# Patient Record
Sex: Female | Born: 1935 | Race: White | Hispanic: No | Marital: Married | Smoking: Never smoker
Health system: Southern US, Community
[De-identification: ages and names within clinical notes are randomized; demographics above are authoritative.]

## PROBLEM LIST (undated history)

## (undated) DIAGNOSIS — Z8719 Personal history of other diseases of the digestive system: Secondary | ICD-10-CM

## (undated) DIAGNOSIS — Z8489 Family history of other specified conditions: Secondary | ICD-10-CM

## (undated) DIAGNOSIS — J45909 Unspecified asthma, uncomplicated: Secondary | ICD-10-CM

## (undated) DIAGNOSIS — I442 Atrioventricular block, complete: Secondary | ICD-10-CM

## (undated) DIAGNOSIS — E785 Hyperlipidemia, unspecified: Secondary | ICD-10-CM

## (undated) DIAGNOSIS — I10 Essential (primary) hypertension: Secondary | ICD-10-CM

## (undated) DIAGNOSIS — T4145XA Adverse effect of unspecified anesthetic, initial encounter: Secondary | ICD-10-CM

## (undated) DIAGNOSIS — C9200 Acute myeloblastic leukemia, not having achieved remission: Secondary | ICD-10-CM

## (undated) DIAGNOSIS — M316 Other giant cell arteritis: Secondary | ICD-10-CM

## (undated) DIAGNOSIS — M353 Polymyalgia rheumatica: Secondary | ICD-10-CM

## (undated) DIAGNOSIS — I2510 Atherosclerotic heart disease of native coronary artery without angina pectoris: Secondary | ICD-10-CM

## (undated) DIAGNOSIS — M1990 Unspecified osteoarthritis, unspecified site: Secondary | ICD-10-CM

## (undated) DIAGNOSIS — K589 Irritable bowel syndrome without diarrhea: Secondary | ICD-10-CM

## (undated) DIAGNOSIS — Z9889 Other specified postprocedural states: Secondary | ICD-10-CM

## (undated) DIAGNOSIS — Z90710 Acquired absence of both cervix and uterus: Secondary | ICD-10-CM

## (undated) HISTORY — DX: Polymyalgia rheumatica: M35.3

## (undated) HISTORY — PX: CHOLECYSTECTOMY: SHX55

## (undated) HISTORY — PX: EYE SURGERY: SHX253

## (undated) HISTORY — PX: TONSILLECTOMY: SUR1361

## (undated) HISTORY — PX: INSERT / REPLACE / REMOVE PACEMAKER: SUR710

## (undated) HISTORY — PX: ABDOMINAL HYSTERECTOMY: SHX81

## (undated) HISTORY — DX: Acquired absence of both cervix and uterus: Z90.710

## (undated) HISTORY — DX: Essential (primary) hypertension: I10

## (undated) HISTORY — PX: OTHER SURGICAL HISTORY: SHX169

## (undated) HISTORY — PX: APPENDECTOMY: SHX54

## (undated) HISTORY — DX: Other specified postprocedural states: Z98.890

## (undated) HISTORY — DX: Acute myeloblastic leukemia, not having achieved remission: C92.00

## (undated) HISTORY — DX: Atrioventricular block, complete: I44.2

## (undated) HISTORY — PX: TEMPORAL ARTERY BIOPSY / LIGATION: SUR132

## (undated) HISTORY — PX: BREAST BIOPSY: SHX20

## (undated) HISTORY — DX: Hyperlipidemia, unspecified: E78.5

## (undated) HISTORY — DX: Other giant cell arteritis: M31.6

## (undated) HISTORY — DX: Irritable bowel syndrome, unspecified: K58.9

## (undated) HISTORY — PX: KNEE ARTHROSCOPY: SUR90

## (undated) HISTORY — DX: Atherosclerotic heart disease of native coronary artery without angina pectoris: I25.10

---

## 1997-10-15 ENCOUNTER — Other Ambulatory Visit: Admission: RE | Admit: 1997-10-15 | Discharge: 1997-10-15 | Payer: Self-pay | Admitting: *Deleted

## 1998-07-21 ENCOUNTER — Other Ambulatory Visit: Admission: RE | Admit: 1998-07-21 | Discharge: 1998-07-21 | Payer: Self-pay | Admitting: *Deleted

## 1998-07-30 ENCOUNTER — Ambulatory Visit (HOSPITAL_COMMUNITY): Admission: RE | Admit: 1998-07-30 | Discharge: 1998-07-30 | Payer: Self-pay | Admitting: *Deleted

## 1999-04-05 HISTORY — PX: CARDIAC CATHETERIZATION: SHX172

## 1999-06-09 ENCOUNTER — Ambulatory Visit (HOSPITAL_COMMUNITY): Admission: RE | Admit: 1999-06-09 | Discharge: 1999-06-09 | Payer: Self-pay | Admitting: *Deleted

## 1999-07-27 ENCOUNTER — Other Ambulatory Visit: Admission: RE | Admit: 1999-07-27 | Discharge: 1999-07-27 | Payer: Self-pay | Admitting: *Deleted

## 2001-03-19 ENCOUNTER — Encounter: Payer: Self-pay | Admitting: Cardiology

## 2001-03-19 ENCOUNTER — Encounter: Admission: RE | Admit: 2001-03-19 | Discharge: 2001-03-19 | Payer: Self-pay | Admitting: Cardiology

## 2001-03-22 ENCOUNTER — Ambulatory Visit (HOSPITAL_COMMUNITY): Admission: RE | Admit: 2001-03-22 | Discharge: 2001-03-23 | Payer: Self-pay | Admitting: Cardiology

## 2001-05-02 ENCOUNTER — Encounter (HOSPITAL_COMMUNITY): Admission: RE | Admit: 2001-05-02 | Discharge: 2001-07-31 | Payer: Self-pay | Admitting: Cardiology

## 2001-08-16 ENCOUNTER — Encounter: Admission: RE | Admit: 2001-08-16 | Discharge: 2001-08-16 | Payer: Self-pay | Admitting: Internal Medicine

## 2001-08-16 ENCOUNTER — Encounter: Payer: Self-pay | Admitting: Internal Medicine

## 2003-04-25 ENCOUNTER — Encounter: Admission: RE | Admit: 2003-04-25 | Discharge: 2003-04-25 | Payer: Self-pay | Admitting: Internal Medicine

## 2003-06-09 ENCOUNTER — Ambulatory Visit (HOSPITAL_COMMUNITY): Admission: RE | Admit: 2003-06-09 | Discharge: 2003-06-09 | Payer: Self-pay | Admitting: Vascular Surgery

## 2003-06-23 ENCOUNTER — Encounter (INDEPENDENT_AMBULATORY_CARE_PROVIDER_SITE_OTHER): Payer: Self-pay | Admitting: *Deleted

## 2003-06-23 ENCOUNTER — Ambulatory Visit (HOSPITAL_COMMUNITY): Admission: RE | Admit: 2003-06-23 | Discharge: 2003-06-23 | Payer: Self-pay | Admitting: Vascular Surgery

## 2004-06-07 ENCOUNTER — Encounter: Admission: RE | Admit: 2004-06-07 | Discharge: 2004-06-07 | Payer: Self-pay | Admitting: Gynecology

## 2004-07-13 ENCOUNTER — Encounter: Admission: RE | Admit: 2004-07-13 | Discharge: 2004-07-13 | Payer: Self-pay | Admitting: Surgery

## 2004-07-19 ENCOUNTER — Encounter (INDEPENDENT_AMBULATORY_CARE_PROVIDER_SITE_OTHER): Payer: Self-pay | Admitting: *Deleted

## 2004-07-19 ENCOUNTER — Ambulatory Visit (HOSPITAL_BASED_OUTPATIENT_CLINIC_OR_DEPARTMENT_OTHER): Admission: RE | Admit: 2004-07-19 | Discharge: 2004-07-19 | Payer: Self-pay | Admitting: Surgery

## 2004-07-19 ENCOUNTER — Ambulatory Visit (HOSPITAL_COMMUNITY): Admission: RE | Admit: 2004-07-19 | Discharge: 2004-07-19 | Payer: Self-pay | Admitting: Surgery

## 2005-07-15 ENCOUNTER — Encounter: Admission: RE | Admit: 2005-07-15 | Discharge: 2005-07-15 | Payer: Self-pay | Admitting: Gynecology

## 2005-07-22 ENCOUNTER — Encounter: Admission: RE | Admit: 2005-07-22 | Discharge: 2005-07-22 | Payer: Self-pay | Admitting: Gynecology

## 2006-01-12 ENCOUNTER — Encounter: Admission: RE | Admit: 2006-01-12 | Discharge: 2006-01-12 | Payer: Self-pay | Admitting: Gynecology

## 2006-07-17 ENCOUNTER — Encounter: Admission: RE | Admit: 2006-07-17 | Discharge: 2006-07-17 | Payer: Self-pay | Admitting: Gynecology

## 2007-07-18 ENCOUNTER — Encounter: Admission: RE | Admit: 2007-07-18 | Discharge: 2007-07-18 | Payer: Self-pay | Admitting: Internal Medicine

## 2007-07-30 ENCOUNTER — Encounter: Admission: RE | Admit: 2007-07-30 | Discharge: 2007-07-30 | Payer: Self-pay | Admitting: Internal Medicine

## 2007-11-15 HISTORY — PX: PACEMAKER INSERTION: SHX728

## 2007-12-11 ENCOUNTER — Encounter: Admission: RE | Admit: 2007-12-11 | Discharge: 2007-12-11 | Payer: Self-pay | Admitting: Cardiology

## 2008-07-16 ENCOUNTER — Encounter: Admission: RE | Admit: 2008-07-16 | Discharge: 2008-07-16 | Payer: Self-pay | Admitting: Sports Medicine

## 2008-08-04 ENCOUNTER — Encounter: Admission: RE | Admit: 2008-08-04 | Discharge: 2008-08-04 | Payer: Self-pay | Admitting: Internal Medicine

## 2008-10-10 ENCOUNTER — Encounter: Payer: Self-pay | Admitting: Cardiology

## 2008-12-18 ENCOUNTER — Inpatient Hospital Stay (HOSPITAL_COMMUNITY): Admission: EM | Admit: 2008-12-18 | Discharge: 2008-12-21 | Payer: Self-pay | Admitting: Emergency Medicine

## 2008-12-19 ENCOUNTER — Ambulatory Visit: Payer: Self-pay | Admitting: Vascular Surgery

## 2008-12-19 ENCOUNTER — Encounter (INDEPENDENT_AMBULATORY_CARE_PROVIDER_SITE_OTHER): Payer: Self-pay | Admitting: Cardiovascular Disease

## 2008-12-29 DIAGNOSIS — E785 Hyperlipidemia, unspecified: Secondary | ICD-10-CM | POA: Insufficient documentation

## 2008-12-29 DIAGNOSIS — R55 Syncope and collapse: Secondary | ICD-10-CM | POA: Insufficient documentation

## 2008-12-29 DIAGNOSIS — I10 Essential (primary) hypertension: Secondary | ICD-10-CM | POA: Insufficient documentation

## 2008-12-29 DIAGNOSIS — I2510 Atherosclerotic heart disease of native coronary artery without angina pectoris: Secondary | ICD-10-CM

## 2008-12-30 ENCOUNTER — Ambulatory Visit: Payer: Self-pay | Admitting: Cardiology

## 2008-12-30 DIAGNOSIS — Z950 Presence of cardiac pacemaker: Secondary | ICD-10-CM

## 2009-01-29 ENCOUNTER — Ambulatory Visit: Payer: Self-pay | Admitting: Internal Medicine

## 2009-03-02 ENCOUNTER — Ambulatory Visit: Payer: Self-pay | Admitting: Cardiology

## 2009-04-04 DIAGNOSIS — T8859XA Other complications of anesthesia, initial encounter: Secondary | ICD-10-CM

## 2009-04-04 HISTORY — DX: Other complications of anesthesia, initial encounter: T88.59XA

## 2009-05-06 ENCOUNTER — Telehealth (INDEPENDENT_AMBULATORY_CARE_PROVIDER_SITE_OTHER): Payer: Self-pay | Admitting: *Deleted

## 2009-07-06 ENCOUNTER — Ambulatory Visit: Payer: Self-pay

## 2009-07-06 ENCOUNTER — Encounter: Payer: Self-pay | Admitting: Cardiology

## 2009-07-10 ENCOUNTER — Encounter: Admission: RE | Admit: 2009-07-10 | Discharge: 2009-07-10 | Payer: Self-pay | Admitting: Rheumatology

## 2009-08-07 ENCOUNTER — Encounter: Admission: RE | Admit: 2009-08-07 | Discharge: 2009-08-07 | Payer: Self-pay | Admitting: Internal Medicine

## 2009-10-28 ENCOUNTER — Encounter: Admission: RE | Admit: 2009-10-28 | Discharge: 2009-10-28 | Payer: Self-pay | Admitting: Rheumatology

## 2010-01-29 ENCOUNTER — Ambulatory Visit: Payer: Self-pay | Admitting: Cardiology

## 2010-02-03 ENCOUNTER — Encounter: Payer: Self-pay | Admitting: Cardiology

## 2010-02-10 ENCOUNTER — Encounter: Payer: Self-pay | Admitting: Cardiology

## 2010-05-04 NOTE — Cardiovascular Report (Signed)
Summary: Office Visit   Office Visit   Imported By: Roderic Ovens 08/24/2009 16:32:54  _____________________________________________________________________  External Attachment:    Type:   Image     Comment:   External Document

## 2010-05-04 NOTE — Progress Notes (Signed)
  Faxed Cardiac records over to [facility] to fax [phone] [facility]  May 06, 2009 11:15 AM

## 2010-05-04 NOTE — Assessment & Plan Note (Signed)
Summary: 414.01 780.2  DUE 10/11  PFH,RN   Visit Type:  Follow-up Primary Provider:  Dr. Felipa Eth  CC:  Syncope and CAD.  History of Present Illness: The patient returns for yearly followup. Since I last saw her she has had no new cardiovascular complaints. Unfortunately she is limited by multiple joint pains. She does some activities such as vacuuming. With this she does not get chest pressure, neck or arm discomfort. She does not have new shortness of breath, PND or orthopnea. She does not have any palpitations and has had no further syncope. Her pacemaker was interrogated today and she is pacing about 72% of the time with normal function.  Current Medications (verified): 1)  Lipitor 20 Mg Tabs (Atorvastatin Calcium) .... Daily 2)  Aspirin 81 Mg  Tabs (Aspirin) .... Daily 3)  I Caps .... Daily 4)  Tramadol Hcl 50 Mg Tabs (Tramadol Hcl) .... As Needed 5)  Prednisone 5 Mg Tabs (Prednisone) .... Uad  Allergies (verified): 1)  ! * Tramadol 2)  ! Codeine  Past History:  Past Medical History:  Syncope,  Coronary artery disease with non DES stent of her RCA in  2002.    Hypertension.   Hyperlipidemia.   History of St. Jude PM placed for complete heart block.   Irritable bowel syndrome  Polymyalgia rheumatica  Giant cell arteritis  Past Surgical History: Reviewed history from 12/30/2008 and no changes required. Recent arthroscopy of  Appendectomy Tonsillectomy Cholecystectomy Hysterectomy Breast biopsy secondary to nipple discharge Pilonidal cyst resection C-Section Temporal artery biopsy  Review of Systems       As stated in the HPI and negative for all other systems.   Vital Signs:  Patient profile:   76 year old female Height:      61 inches Weight:      149 pounds BMI:     28.26 Pulse rate:   69 / minute Resp:     16 per minute BP sitting:   136 / 64  (right arm)  Vitals Entered By: Marrion Coy, CNA (January 29, 2010 12:10 PM)  Physical Exam  General:  Well  developed, well nourished, in no acute distress. Head:  normocephalic and atraumatic Eyes:  PERRLA/EOM intact; conjunctiva and lids normal. Neck:  Neck supple, no JVD. No masses, thyromegaly or abnormal cervical nodes. Chest Wall:  no deformities, well healed pacer pocket Lungs:  Clear bilaterally to auscultation and percussion. Abdomen:  Bowel sounds positive; abdomen soft and non-tender without masses, organomegaly, or hernias noted. No hepatosplenomegaly. Msk:  Back normal, normal gait. Muscle strength and tone normal. Extremities:  No clubbing or cyanosis. Neurologic:  Alert and oriented x 3. Skin:  Intact without lesions or rashes. Cervical Nodes:  no significant adenopathy Axillary Nodes:  no significant adenopathy Inguinal Nodes:  no significant adenopathy Psych:  Normal affect.   Detailed Cardiovascular Exam  Neck    Carotids: Carotids full and equal bilaterally without bruits.      Neck Veins: Normal, no JVD.    Heart    Inspection: no deformities or lifts noted.      Palpation: normal PMI with no thrills palpable.      Auscultation: regular rate and rhythm, S1, S2 without murmurs, rubs, gallops, or clicks.    Vascular    Abdominal Aorta: no palpable masses, pulsations, or audible bruits.      Femoral Pulses: normal femoral pulses bilaterally.      Pedal Pulses: normal pedal pulses bilaterally.  Radial Pulses: normal radial pulses bilaterally.      Peripheral Circulation: no clubbing, cyanosis, or edema noted with normal capillary refill.     EKG  Procedure date:  01/29/2010  Findings:      AV pacing 100% capture  PPM Specifications Following MD:  Rollene Rotunda, MD     PPM Vendor:  St Jude     PPM Model Number:  [id number]     PPM Serial Number:  [id number] DOI:  11/15/2007     PPM Implanting MD:  NOT IMPLANTED HERE  Lead 1    Location: RA     DOI: 11/15/2007     Model #: 1642T     Serial #: [id number]     Status: active Lead 2    Location: RV     DOI:  11/15/2007     Model #: 1888TC     Serial #: [id number]     Status: active  Magnet Response Rate:  BOL 98.6 ERI 86.3  Indications:  CHB Pacer dependent   PPM Follow Up Battery Voltage:  2.78 V     Battery Est. Longevity:  6.75-9.25 yrs     Pacer Dependent:  Yes       PPM Device Measurements Atrium  Amplitude: 2.9 mV, Impedance: 467 ohms, Threshold: 0.50 V at 0.5 msec Right Ventricle  Amplitude: PACED mV, Impedance: 693 ohms, Threshold: 0.750 V at 0.5 msec  Episodes MS Episodes:  1     Percent Mode Switch:  <1%     Coumadin:  No Ventricular High Rate:  0     Atrial Pacing:  69%     Ventricular Pacing:  99%  Parameters Mode:  DDDR     Lower Rate Limit:  70     Upper Rate Limit:  120 Paced AV Delay:  200     Sensed AV Delay:  180 Next Cardiology Appt Due:  08/03/2010 Tech Comments:  1 AMS EPISODES--LONGEST WAS 6 SECONDS.  NORMAL DEVICE FUNCTION.  PACER DEPENDENT.  CHANGED RV OUTPUT FROM 1.125 TO 1.00 V DUE TO AUTOCAPTURE BEING ON.  ROV IN 6 MTHS W/DEVICE CLINIC. Vella Kohler  January 29, 2010 12:38 PM  Impression & Recommendations:  Problem # 1:  CAD (ICD-414.00) She has had no new chest discomfort reminiscent of previous problems. She will continue with risk reduction. Orders: EKG w/ Interpretation (93000)  Problem # 2:  PACEMAKER, PERMANENT (ICD-V45.01) Her pacemaker is functioning normally and she will be seen in pacer clinic in 6 months. Orders: EKG w/ Interpretation (93000)  Problem # 3:  HYPERTENSION (ICD-401.9)  Her blood pressure is controlled. She will continue the meds as listed.  Problem # 4:  HYPERLIPIDEMIA (ICD-272.4) Per her primary M.D. She is due to have this drawn soon. The goal should be an LDL less than 100 and HDL greater than 50.  Patient Instructions: 1)  Your physician recommends that you schedule a follow-up appointment in: 12 months 2)  Your physician recommends that you continue on your current medications as directed. Please refer to the  Current Medication list given to you today.

## 2010-05-04 NOTE — Letter (Signed)
Summary: [facility] Annual Physical Exam  [facility] Annual Physical Exam   Imported By: Roderic Ovens 03/11/2010 10:18:25  _____________________________________________________________________  External Attachment:    Type:   Image     Comment:   External Document

## 2010-05-04 NOTE — Procedures (Signed)
Summary: device check st jude   Current Medications (verified): 1)  Lipitor 20 Mg Tabs (Atorvastatin Calcium) .... Daily 2)  Toprol Xl 25 Mg Xr24h-Tab (Metoprolol Succinate) .Marland Kitchen.. 1 By Mouth Daily 3)  Aspirin 81 Mg  Tabs (Aspirin) .... Daily 4)  I Caps .... Daily  Allergies (verified): 1)  ! * Tramadol 2)  ! Codeine   PPM Specifications Following MD:  Rollene Rotunda, MD     PPM Vendor:  St Jude     PPM Model Number:  [id number]     PPM Serial Number:  [id number] PPM DOI:  11/15/2007     PPM Implanting MD:  NOT IMPLANTED HERE  Lead 1    Location: RA     DOI: 11/15/2007     Model #: 1642T     Serial #: [id number]     Status: active Lead 2    Location: RV     DOI: 11/15/2007     Model #: 1888TC     Serial #: [id number]     Status: active  Magnet Response Rate:  BOL 98.6 ERI 86.3  Indications:  CHB Pacer dependent   PPM Follow Up Remote Check?  No Battery Voltage:  2.80 V     Battery Est. Longevity:  10 YEARS     Pacer Dependent:  Yes       PPM Device Measurements Atrium  Amplitude: 2.1 mV, Impedance: 475 ohms, Threshold: 0.5 V at 0.5 msec Right Ventricle  Amplitude: PACED AT 30 mV, Impedance: 687 ohms, Threshold: 0.75 V at 0.5 msec  Episodes MS Episodes:  12     Percent Mode Switch:  <1%     Coumadin:  No Ventricular High Rate:  NOT AVAILABLE     Atrial Pacing:  59%     Ventricular Pacing:  >99%  Parameters Mode:  DDDR     Lower Rate Limit:  70     Upper Rate Limit:  120 Paced AV Delay:  200     Sensed AV Delay:  180 Next Cardiology Appt Due:  01/02/2010 Tech Comments:  Normal device function.  EGM storage turned off today to promote battery longevity.  No other changes made.  All mode switch episodes less than 1 minute.  ROV 6 months JH. Gypsy Balsam RN BSN  July 06, 2009 3:40 PM

## 2010-05-04 NOTE — Cardiovascular Report (Signed)
Summary: Office Visit   Office Visit   Imported By: Roderic Ovens 02/03/2010 16:18:16  _____________________________________________________________________  External Attachment:    Type:   Image     Comment:   External Document

## 2010-06-15 ENCOUNTER — Other Ambulatory Visit: Payer: Self-pay | Admitting: Rheumatology

## 2010-06-15 ENCOUNTER — Ambulatory Visit
Admission: RE | Admit: 2010-06-15 | Discharge: 2010-06-15 | Disposition: A | Discharge status: Home / Self Care | Payer: Medicare Other | Source: Ambulatory Visit | Attending: Rheumatology | Admitting: Rheumatology

## 2010-06-15 DIAGNOSIS — M549 Dorsalgia, unspecified: Secondary | ICD-10-CM

## 2010-06-15 DIAGNOSIS — R079 Chest pain, unspecified: Secondary | ICD-10-CM

## 2010-06-15 MED ORDER — IOHEXOL 300 MG/ML  SOLN
75.0000 mL | Freq: Once | INTRAMUSCULAR | Status: AC | PRN
  Administered 2010-06-15: 75 mL via INTRAVENOUS

## 2010-07-09 LAB — BASIC METABOLIC PANEL
BUN: 16 mg/dL (ref 6–23)
BUN: 18 mg/dL (ref 6–23)
CO2: 27 mEq/L (ref 19–32)
CO2: 28 mEq/L (ref 19–32)
CO2: 29 mEq/L (ref 19–32)
Calcium: 8.7 mg/dL (ref 8.4–10.5)
Calcium: 8.9 mg/dL (ref 8.4–10.5)
Chloride: 102 mEq/L (ref 96–112)
Chloride: 104 mEq/L (ref 96–112)
Chloride: 105 mEq/L (ref 96–112)
Chloride: 106 mEq/L (ref 96–112)
Creatinine, Ser: 0.87 mg/dL (ref 0.4–1.2)
Creatinine, Ser: 0.91 mg/dL (ref 0.4–1.2)
Creatinine, Ser: 0.93 mg/dL (ref 0.4–1.2)
GFR calc Af Amer: >60 mL/min (ref >60)
GFR calc Af Amer: >60 mL/min (ref >60)
GFR calc Af Amer: >60 mL/min (ref >60)
GFR calc non Af Amer: 59 mL/min — ABNORMAL LOW (ref >60)
GFR calc non Af Amer: >60 mL/min (ref >60)
GFR calc non Af Amer: >60 mL/min (ref >60)
GFR calc non Af Amer: >60 mL/min (ref >60)
Glucose, Bld: 105 mg/dL — ABNORMAL HIGH (ref 70–99)
Potassium: 3.7 mEq/L (ref 3.5–5.1)
Potassium: 3.9 mEq/L (ref 3.5–5.1)
Potassium: 4 mEq/L (ref 3.5–5.1)
Potassium: 4.2 mEq/L (ref 3.5–5.1)
Sodium: 140 mEq/L (ref 135–145)
Sodium: 140 mEq/L (ref 135–145)

## 2010-07-09 LAB — CBC
HCT: 33.7 % — ABNORMAL LOW (ref 36.0–46.0)
HCT: 35.9 % — ABNORMAL LOW (ref 36.0–46.0)
HCT: 36.1 % (ref 36.0–46.0)
HCT: 37.6 % (ref 36.0–46.0)
HCT: 39.1 % (ref 36.0–46.0)
Hemoglobin: 11.3 g/dL — ABNORMAL LOW (ref 12.0–15.0)
Hemoglobin: 12.1 g/dL (ref 12.0–15.0)
Hemoglobin: 13.1 g/dL (ref 12.0–15.0)
MCHC: 33.6 g/dL (ref 30.0–36.0)
MCHC: 33.7 g/dL (ref 30.0–36.0)
MCV: 88.8 fL (ref 78.0–100.0)
MCV: 89.4 fL (ref 78.0–100.0)
MCV: 89.6 fL (ref 78.0–100.0)
Platelets: 195 10*3/uL (ref 150–400)
Platelets: 219 10*3/uL (ref 150–400)
RBC: 4.02 MIL/uL (ref 3.87–5.11)
RBC: 4.04 MIL/uL (ref 3.87–5.11)
RDW: 13 % (ref 11.5–15.5)
RDW: 13.5 % (ref 11.5–15.5)
WBC: 7 10*3/uL (ref 4.0–10.5)
WBC: 7.1 10*3/uL (ref 4.0–10.5)
WBC: 8.8 10*3/uL (ref 4.0–10.5)
WBC: 8.8 10*3/uL (ref 4.0–10.5)

## 2010-07-09 LAB — TROPONIN I: Troponin I: 0.13 ng/mL — ABNORMAL HIGH (ref 0.00–0.06)

## 2010-07-09 LAB — CK TOTAL AND CKMB (NOT AT ARMC)
CK, MB: 1.3 ng/mL (ref 0.3–4.0)
Relative Index: INVALID (ref 0.0–2.5)
Total CK: 39 U/L (ref 7–177)

## 2010-07-09 LAB — MAGNESIUM
Magnesium: 2 mg/dL (ref 1.5–2.5)
Magnesium: 2.1 mg/dL (ref 1.5–2.5)

## 2010-07-09 LAB — DIFFERENTIAL
Basophils Relative: 0 % (ref 0–1)
Eosinophils Absolute: 0.1 10*3/uL (ref 0.0–0.7)
Eosinophils Relative: 1 % (ref 0–5)
Monocytes Absolute: 0.4 10*3/uL (ref 0.1–1.0)
Monocytes Relative: 5 % (ref 3–12)

## 2010-07-09 LAB — CARDIAC PANEL(CRET KIN+CKTOT+MB+TROPI)
Relative Index: INVALID (ref 0.0–2.5)
Total CK: 35 U/L (ref 7–177)
Troponin I: 0.1 ng/mL — ABNORMAL HIGH (ref 0.00–0.06)

## 2010-07-09 LAB — HEPARIN LEVEL (UNFRACTIONATED)
Heparin Unfractionated: 0.11 IU/mL — ABNORMAL LOW (ref 0.30–0.70)
Heparin Unfractionated: 0.29 IU/mL — ABNORMAL LOW (ref 0.30–0.70)

## 2010-07-09 LAB — POCT CARDIAC MARKERS: Myoglobin, poc: 89.8 ng/mL (ref 12–200)

## 2010-07-14 ENCOUNTER — Other Ambulatory Visit: Payer: Self-pay | Admitting: Internal Medicine

## 2010-07-14 DIAGNOSIS — Z1231 Encounter for screening mammogram for malignant neoplasm of breast: Secondary | ICD-10-CM

## 2010-07-15 ENCOUNTER — Encounter: Payer: Self-pay | Admitting: *Deleted

## 2010-07-28 ENCOUNTER — Ambulatory Visit (INDEPENDENT_AMBULATORY_CARE_PROVIDER_SITE_OTHER): Payer: Medicare Other | Admitting: *Deleted

## 2010-07-28 DIAGNOSIS — I442 Atrioventricular block, complete: Secondary | ICD-10-CM

## 2010-08-20 NOTE — Cardiovascular Report (Signed)
[place]  Patient:    Jamie Reese, Jamie Reese Visit Number: [id number] MRN: [id number]          Service Type: Attending:  Julieanne Manson, M.D. Dictated by:   Julieanne Manson, M.D. Proc. Date: 03/22/01   CC:         Petra Kuba, M.D.  Cardiac Catheterization Laboratory   Cardiac Catheterization  INDICATIONS FOR PROCEDURE: The patient is a 77 year old female, who has a chronic left bundle-branch block. She has developed chest pain over the last several months that has progressed to point it wakes her up at sleeps, radiates to her upper back relieved with nitroglycerin. Because of her left bundle-branch block a Persantine Cardiolite was done that showed significant inferolateral ischemia. She is brought in for cardiac catheterization.  DESCRIPTION OF PROCEDURE: The patient was prepped and draped in the usual sterile fashion exposing the right groin.  Following local anesthetic with 1% Xylocaine, the Seldinger technique was employed and a 5 [place] introducer sheath was placed in the right femoral artery.  Selective right and left coronary arteriography and ventriculography in the RAO projection was performed.  EQUIPMENT: A 5 French Judkins configuration catheters.  COMPLICATIONS: None.  RESULTS: 1. Hemodynamic monitoring: Central aortic pressure 130/61, left ventricular    pressure 130/15 with no aortic valve gradient noted at the time of    pullback. 2. Ventriculography: Ventriculography in the RAO projection revealed normal    left ventricular systolic function with ejection fraction of over 55%. The    end-diastolic pressure was 17. The posterobasilar segment appeared to be    akinetic. There was mitral valve prolapse without mitral regurgitation.  CORONARY ARTERIOGRAPHY: No calcification was appreciated on fluoroscopy in the distribution of the coronary arteries. 1. Left main: Normal. 2. LAD: The LAD crossed the apex of the heart and gave  rise to one large    first diagonal branch. This system was free of disease. 3. Circumflex: Minor irregularities otherwise normal. 4. Optimal diagonal: Large vessel free of disease. 5. Right coronary artery: The right coronary artery had a focal area of    95% obstruction in its midportion. There were irregularities in the    proximal and distal portion of the right coronary artery, the posterior    descending artery and posterolateral vessel were free of disease.  Because of the high-grade stenosis found in the right coronary artery arrangements were made for intervention.  A 7 French sheath was exchanged with a previously placed 5 [place] sheath. The JR4 7 [place] guide catheter with side holes was used. A short luge wire was placed down the right coronary artery of the distal portion of the posterolateral vessel. A 2.5 x 15 CrossSail was placed in the area of the the obstruction in two inflations, one 8 for 58 seconds and 8 for 45 seconds was performed. With this, there was less than 40% narrowing following the angioplasty.  A stent was made ready, a 2.75 x 12 Express stent. It was placed in such a manner and both the proximal and distal portions were well covered and it was initially deployed at 12 x 50 seconds, and the final inflation was 11 x 58 seconds.  Following inflation of the stent, the vessel appeared to be hyperexpanded at the area of the stent.  There was no evidence of any dissection or thrombus.  The previously 95% area now appeared to be normal to slightly hyperexpanded.  The patient was given 5700 units of intravenous heparin. He  was given double bolus Integrilin and on an Integrilin drip and at the end of the procedure received 150 mg of oral Plavix. Total contrast 150 cc.  Integrilin will be continued for 12 hours.  The patient should be ready for discharge in the morning. Dictated by:   Julieanne Manson, M.D. Attending:  Julieanne Manson, M.D. DD:  03/22/01 TD:   03/22/01 Job: [id number]

## 2010-08-20 NOTE — Op Note (Signed)
NAMEJERRILYNN, Jamie Reese NO.:  [id number]   MEDICAL RECORD NO.:  [id number]          PATIENT TYPE:  INP   LOCATION:                               FACILITY:  [facility]   PHYSICIAN:  Currie Paris, M.D.DATE OF BIRTH:  March 01, 1934   DATE OF PROCEDURE:  07/19/2004  DATE OF DISCHARGE:                                 OPERATIVE REPORT   Clincal history: The patient has a spontaneous and copious nipple discharge  on the left.Ductogram had shown what appeared to be some abnormalities of  that particular duct system and excision was recommended.   DESCRIPTION OF PROCEDURE:  Patient was seen in the holding area and she had  no further questions.  We clarified that the small incidentally noted cystic  area in the upper breast was not to be biopsied, simply the area of the  discharging duct.  The left breast was marked both by the patient and myself  as the operative site.   The patient was taken to the operating room and after satisfactory general  anesthesia had been obtained, the left breast was prepped and draped.  The  timeout occurred.   I first injected a little bit of methylene blue into the discharging duct.  I then made a curvilinear incision at the areolar margin and elevated the  skin off of the areola over to the duct where I disconnected the duct from  the remaining tissue and took a little bit of skin button so that we had the  entire origin of the duct.  I then traced the duct deep into the breast and  took a wide excision of breast tissue around it.  The duct actually seemed  to track a little bit superiorly as it got deeper into the breast.  As I  came across a little blue dye and a duct, I went ahead and excised more to  make sure we were completely around this at the margins.  Eventually I had  no blue dye left, so I thought I had completely excised this ductal system.   Bleeders were controlled with cautery.  I injected 0.25% plain Marcaine to  help with  postoperative analgesia.  I closed the breast tissue with some 3-0  Vicryl and the skin with a 4-0 Monocryl subcuticular.  Dermabond was also  used.   The patient tolerated the procedure well.  There were no operative  complications.  All counts were correct.      CJS/MEDQ  D:  07/19/2004  T:  07/19/2004  Job:  [id number]

## 2010-08-20 NOTE — Op Note (Signed)
NAME:  VENEDA, KIRKSEY                         ACCOUNT NO.:  [id number]   MEDICAL RECORD NO.:  [id number]                   PATIENT TYPE:  OIB   LOCATION:  2899                                 FACILITY:  [facility]   PHYSICIAN:  Quita Skye. Hart Rochester, M.D.               DATE OF BIRTH:  06-10-1933   DATE OF PROCEDURE:  06/23/2003  DATE OF DISCHARGE:  06/23/2003                                 OPERATIVE REPORT   PREOPERATIVE DIAGNOSIS:  Rule out temporal arteritis.   POSTOPERATIVE DIAGNOSIS:  Rule out temporal arteritis.   OPERATION PERFORMED:  Bilateral temporal artery biopsy.   SURGEON:  Quita Skye. Hart Rochester, M.D.   ASSISTANT:  Nurse.   ANESTHESIA:  Local.   DESCRIPTION OF PROCEDURE:  The patient was taken to the operating room and  placed in the supine position at which time the left superficial temporal  area was identified and shaved, prepped with Betadine solution and draped in  routine sterile manner.  After infiltration with 1% Xylocaine, a short  transverse incision was made in the superficial temporal area after  identifying the artery with the Doppler.  Dissection was carried down  through subcutaneous tissue and the artery was identified.  It was quite  small but pulsatile.  It was ligated proximally and distally with 3-0 silk  tie and about a 1 to 1.5 cm segment removed.  Adequate hemostasis achieved.  The wound closed with Vicryl in subcuticular fashion and Steri-Strip.  Following this, the right side was prepped in similar fashion and draped in  routine sterile manner and the superficial temporal artery was also  identified on the right with the Doppler.  Short transverse incision was  made and the artery dissected free proximally and distally.  Also ligated  proximally and distally with 3-0 silk tie and divided and a 1 to 2 cm  segment removed.  Both pieces were sent to the lab for evaluation and the  wound was also closed with Vicryl in a subcuticular fashion.  Steri-Strips  and  sterile dressing applied.  The patient was taken to the recovery room in  satisfactory condition.                                               Quita Skye Hart Rochester, M.D.    JDL/MEDQ  D:  06/23/2003  T:  06/24/2003  Job:  [id number]

## 2010-08-24 ENCOUNTER — Ambulatory Visit
Admission: RE | Admit: 2010-08-24 | Discharge: 2010-08-24 | Disposition: A | Discharge status: Home / Self Care | Payer: Medicare Other | Source: Ambulatory Visit | Attending: Internal Medicine | Admitting: Internal Medicine

## 2010-08-24 DIAGNOSIS — Z1231 Encounter for screening mammogram for malignant neoplasm of breast: Secondary | ICD-10-CM

## 2010-12-31 ENCOUNTER — Encounter: Payer: Self-pay | Admitting: Cardiology

## 2011-01-04 ENCOUNTER — Encounter: Payer: Self-pay | Admitting: Cardiology

## 2011-01-04 ENCOUNTER — Ambulatory Visit (INDEPENDENT_AMBULATORY_CARE_PROVIDER_SITE_OTHER): Payer: Medicare Other | Admitting: Cardiology

## 2011-01-04 DIAGNOSIS — E785 Hyperlipidemia, unspecified: Secondary | ICD-10-CM

## 2011-01-04 DIAGNOSIS — Z950 Presence of cardiac pacemaker: Secondary | ICD-10-CM

## 2011-01-04 DIAGNOSIS — I442 Atrioventricular block, complete: Secondary | ICD-10-CM

## 2011-01-04 DIAGNOSIS — I10 Essential (primary) hypertension: Secondary | ICD-10-CM

## 2011-01-04 DIAGNOSIS — R55 Syncope and collapse: Secondary | ICD-10-CM

## 2011-01-04 DIAGNOSIS — I2510 Atherosclerotic heart disease of native coronary artery without angina pectoris: Secondary | ICD-10-CM

## 2011-01-04 LAB — PACEMAKER DEVICE OBSERVATION
AL AMPLITUDE: 2.4 mv
BATTERY VOLTAGE: 2.79 V
RV LEAD AMPLITUDE: 4 mv
RV LEAD IMPEDENCE PM: 645 Ohm
VENTRICULAR PACING PM: 100

## 2011-01-04 NOTE — Assessment & Plan Note (Signed)
The blood pressure is at target. No change in medications is indicated. We will continue with therapeutic lifestyle changes (TLC).  

## 2011-01-04 NOTE — Patient Instructions (Signed)
The current medical regimen is effective;  continue present plan and medications.  Follow up in 1 year with Dr Hochrein.  You will receive a letter in the mail 2 months before you are due.  Please call us when you receive this letter to schedule your follow up appointment.  

## 2011-01-04 NOTE — Progress Notes (Signed)
HPI The patient presents for followup of her known coronary disease and pacemaker. Since I last saw her she has mostly been inhibited in her activities by neck pain and a pleuritic-type back pain that is worse with breathing or motion. She has had a workup of this with an unclear etiology. I did note a CT done earlier this year. She does get some relief with steroids. She doesn't tolerate very many pain medications. This has somewhat limited her activities. She has not been having any chest discomfort similar to previous cardiovascular complaints. She hasn't had any palpitations, presyncope or syncope. She has no new shortness of breath, PND or orthopnea. She has had no weight gain or edema.    Allergies  Allergen Reactions  . Codeine   . Cymbalta (Duloxetine Hcl)   . Tramadol     Current Outpatient Prescriptions  Medication Sig Dispense Refill  . aspirin 81 MG tablet Take 81 mg by mouth daily.        Marland Kitchen atorvastatin (LIPITOR) 20 MG tablet Take 20 mg by mouth daily.        . ergocalciferol (VITAMIN D2) 50000 UNITS capsule Take 50,000 Units by mouth once a week.        . Multiple Vitamin (MULTIVITAMIN) tablet Take 1 tablet by mouth daily.        . predniSONE (DELTASONE) 5 MG tablet Take 5 mg by mouth as needed.       . traMADol (ULTRAM) 50 MG tablet Take 50 mg by mouth every 6 (six) hours as needed.          Past Medical History  Diagnosis Date  . Syncope   . Coronary artery disease   . Hypertension   . Hyperlipidemia   . Pacemaker     complete heart block; St. Jude PM   . Complete heart block   . Irritable bowel syndrome   . Polymyalgia rheumatica   . Giant cell arteritis   . H/O: hysterectomy   . S/P breast biopsy     secondary to nipple discharge    Past Surgical History  Procedure Date  . Appendectomy   . Tonsillectomy   . Cholecystectomy   . Breast biopsy     secondary to nipple discharge  . Pilondial cyst resection   . Cesarean section   . Temporal artery biopsy /  ligation   . Pacemaker insertion     St. Jude Pacemaker    ROS: As stated in the HPI and negative for all other systems.  PHYSICAL EXAM BP 128/70  Pulse 91  Resp 18  Ht 5\' 1"  (1.549 m)  Wt 146 lb (66.225 kg)  BMI 27.59 kg/m2 GENERAL:  Well appearing HEENT:  Pupils equal round and reactive, fundi not visualized, oral mucosa unremarkable NECK:  No jugular venous distention, waveform within normal limits, carotid upstroke brisk and symmetric, no bruits, no thyromegaly LYMPHATICS:  No cervical, inguinal adenopathy LUNGS:  Clear to auscultation bilaterally BACK:  No CVA tenderness CHEST:  Pacemaker pocket intact HEART:  PMI not displaced or sustained,S1 and S2 within normal limits, no S3, no S4, no clicks, no rubs, no murmurs ABD:  Flat, positive bowel sounds normal in frequency in pitch, no bruits, no rebound, no guarding, no midline pulsatile mass, no hepatomegaly, no splenomegaly EXT:  2 plus pulses throughout, no edema, no cyanosis no clubbing SKIN:  No rashes no nodules NEURO:  Cranial nerves II through XII grossly intact, motor grossly intact throughout PSYCH:  Cognitively intact,  oriented to person place and time  ASSESSMENT AND PLAN

## 2011-01-04 NOTE — Assessment & Plan Note (Signed)
She has had no further events.  No change in therapy is indicated.

## 2011-01-04 NOTE — Assessment & Plan Note (Signed)
She reports that this is followed closely by her primary provider.  I will defer to his management.

## 2011-01-04 NOTE — Assessment & Plan Note (Signed)
The pacemaker was checked today and has normal function and parameters.  No change in therapy is indicated.  Readings for this evaluation are recorded elsewhere.

## 2011-01-04 NOTE — Assessment & Plan Note (Signed)
She has had no new symptoms.  Her last stress test was in 2010.  No further studies are indicated.  She should continue with risk reduction.  I did review the hospital records from 2010.

## 2011-03-03 ENCOUNTER — Ambulatory Visit (INDEPENDENT_AMBULATORY_CARE_PROVIDER_SITE_OTHER): Payer: Medicare Other | Admitting: Physician Assistant

## 2011-03-03 ENCOUNTER — Encounter: Payer: Self-pay | Admitting: Physician Assistant

## 2011-03-03 DIAGNOSIS — I10 Essential (primary) hypertension: Secondary | ICD-10-CM

## 2011-03-03 DIAGNOSIS — I2510 Atherosclerotic heart disease of native coronary artery without angina pectoris: Secondary | ICD-10-CM

## 2011-03-03 DIAGNOSIS — R079 Chest pain, unspecified: Secondary | ICD-10-CM | POA: Insufficient documentation

## 2011-03-03 NOTE — Assessment & Plan Note (Signed)
Patient has history of stenting to the RCA 10 years ago. With recent chest pain we'll schedule a Lexiscan.

## 2011-03-03 NOTE — Assessment & Plan Note (Signed)
Patient had chest pain radiating to her neck the day after Thanksgiving and several times since. He does much improved after her primary M.D. Added Prilosec and Mylanta. She does have history of coronary artery disease and dyspnea on exertion. We will schedule a Lexiscan.

## 2011-03-03 NOTE — Patient Instructions (Signed)
Your physician has requested that you have a lexiscan myoview. For further information please visit [email or web address] Please follow instruction sheet, as given.  Your physician recommends that you schedule a follow-up appointment in: 1 month with Dr. Antoine Poche  Your physician recommends that you continue on your current medications as directed. Please refer to the Current Medication list given to you today.

## 2011-03-03 NOTE — Assessment & Plan Note (Signed)
controlled 

## 2011-03-03 NOTE — Progress Notes (Signed)
HPI:  This is a 77 year old female patient who has a history of coronary artery disease status post stent to the RCA 10 years ago. She was previously followed by Dr. Caprice Kluver but is now followed by Dr. Antoine Poche. Her last stress test was in 2010 and was stable. She has had prior chemical stress test by Dr. Clarene Duke and said she had a reaction but she can't remember which test or what happened.  The patient comes in today complaining of chest pain that happened Friday after Thanksgiving. She describes it as a chest heaviness or heartburn sensation in the center of her chest going up into her neck and throat and jaw. It happened 2-3 times on Friday, twice on Maralyn Sago day and once on Sunday. She went to see her primary M.D. On Wednesday and EKG was stable. Blood work and chest x-ray were all normal according to the patient. We do have records of labs and EKG. The patient was placed on Prilosec and Mylanta and is feeling much better. She had one episode of the chest pain when she laid down Wednesday night which eased with nitroglycerin. She hadn't tried nitroglycerin before that. She says the GI medicine have made a big difference. She says most the pain occurs when she is sitting down. She has no exertional chest pain. She does complain of dyspnea on exertion when she showers but thinks this may be related to her chronic back pain that she's been having. She is so uncomfortable in the office because of her back pain she can hardly sit still. She is retching constantly.  The patient is also in need of cataract surgery.  Allergies  Allergen Reactions  . Codeine   . Cymbalta (Duloxetine Hcl)   . Tramadol     Current Outpatient Prescriptions on File Prior to Visit  Medication Sig Dispense Refill  . aspirin 81 MG tablet Take 81 mg by mouth daily.        Marland Kitchen atorvastatin (LIPITOR) 20 MG tablet Take 20 mg by mouth daily.        . ergocalciferol (VITAMIN D2) 50000 UNITS capsule Take 50,000 Units by mouth once a  week.        . Multiple Vitamin (MULTIVITAMIN) tablet Take 1 tablet by mouth daily.        . predniSONE (DELTASONE) 5 MG tablet Take 5 mg by mouth as needed.       . traMADol (ULTRAM) 50 MG tablet Take 50 mg by mouth every 6 (six) hours as needed.          Past Medical History  Diagnosis Date  . Syncope   . Coronary artery disease   . Hypertension   . Hyperlipidemia   . Pacemaker     complete heart block; St. Jude PM   . Complete heart block   . Irritable bowel syndrome   . Polymyalgia rheumatica   . Giant cell arteritis   . H/O: hysterectomy   . S/P breast biopsy     secondary to nipple discharge    Past Surgical History  Procedure Date  . Appendectomy   . Tonsillectomy   . Cholecystectomy   . Breast biopsy     secondary to nipple discharge  . Pilondial cyst resection   . Cesarean section   . Temporal artery biopsy / ligation   . Pacemaker insertion     St. Jude Pacemaker    No family history on file.  History   Social History  . Marital  Status: Married    Spouse Name: N/A    Number of Children: N/A  . Years of Education: N/A   Occupational History  . Not on file.   Social History Main Topics  . Smoking status: Never Smoker   . Smokeless tobacco: Never Used  . Alcohol Use: No  . Drug Use: No  . Sexually Active: Not on file   Other Topics Concern  . Not on file   Social History Narrative  . No narrative on file    ROS: See HPI Eyes: needs repeat cataract surgery. Ears:Negative for hearing loss, tinnitus Cardiovascular: Negative for palpitations,irregular heartbeat,  near-syncope, orthopnea, paroxysmal nocturnal dyspnia and syncope, claudication, cyanosis,. she does have mild ankle edema on occasion Respiratory:   Negative for cough, hemoptysis, sleep disturbances due to breathing, sputum production and wheezing.   Endocrine: Negative for cold intolerance and heat intolerance.  Hematologic/Lymphatic: Negative for adenopathy and bleeding problem.  Does not bruise/bleed easily.  Musculoskeletal: significant back pain   Gastrointestinal:positive for reflux, Negative for nausea, vomiting, abdominal pain, diarrhea, constipation.   Neurological: Negative.  Allergic/Immunologic: Negative for environmental allergies.   PHYSICAL EXAM: Well-nournished,having significant back pain. Neck: No JVD, HJR, Bruit, or thyroid enlargement Lungs: No tachypnea, clear without wheezing, rales, or rhonchi Cardiovascular: RRR, PMI not displaced,positive S4 1/6 systolic murmur at the left sternal border, no bruit, thrill, or heave. Abdomen: BS normal. Soft without organomegaly, masses, lesions or tenderness. Extremities: trace of ankle edema bilaterally, without cyanosis, clubbingAV paced. Good distal pulses bilateral SKin: Warm, no lesions or rashes  Musculoskeletal: No deformities Neuro: no focal signs  BP 131/58  Pulse 71  Ht 5\' 1"  (1.549 m)  Wt 143 lb 6.4 oz (65.046 kg)  BMI 27.10 kg/m2  EKG:AV paced

## 2011-03-10 ENCOUNTER — Other Ambulatory Visit (HOSPITAL_COMMUNITY): Payer: Self-pay | Admitting: *Deleted

## 2011-03-14 ENCOUNTER — Ambulatory Visit (HOSPITAL_COMMUNITY): Payer: Medicare Other | Attending: Internal Medicine | Admitting: Radiology

## 2011-03-14 DIAGNOSIS — I2510 Atherosclerotic heart disease of native coronary artery without angina pectoris: Secondary | ICD-10-CM

## 2011-03-14 DIAGNOSIS — R000 Tachycardia, unspecified: Secondary | ICD-10-CM | POA: Insufficient documentation

## 2011-03-14 DIAGNOSIS — J45909 Unspecified asthma, uncomplicated: Secondary | ICD-10-CM | POA: Insufficient documentation

## 2011-03-14 DIAGNOSIS — R0602 Shortness of breath: Secondary | ICD-10-CM

## 2011-03-14 DIAGNOSIS — R079 Chest pain, unspecified: Secondary | ICD-10-CM

## 2011-03-14 DIAGNOSIS — Z8249 Family history of ischemic heart disease and other diseases of the circulatory system: Secondary | ICD-10-CM | POA: Insufficient documentation

## 2011-03-14 DIAGNOSIS — R0989 Other specified symptoms and signs involving the circulatory and respiratory systems: Secondary | ICD-10-CM | POA: Insufficient documentation

## 2011-03-14 DIAGNOSIS — R0609 Other forms of dyspnea: Secondary | ICD-10-CM | POA: Insufficient documentation

## 2011-03-14 DIAGNOSIS — I10 Essential (primary) hypertension: Secondary | ICD-10-CM | POA: Insufficient documentation

## 2011-03-14 DIAGNOSIS — I447 Left bundle-branch block, unspecified: Secondary | ICD-10-CM

## 2011-03-14 DIAGNOSIS — R6884 Jaw pain: Secondary | ICD-10-CM | POA: Insufficient documentation

## 2011-03-14 DIAGNOSIS — M542 Cervicalgia: Secondary | ICD-10-CM | POA: Insufficient documentation

## 2011-03-14 DIAGNOSIS — E785 Hyperlipidemia, unspecified: Secondary | ICD-10-CM | POA: Insufficient documentation

## 2011-03-14 MED ORDER — TECHNETIUM TC 99M TETROFOSMIN IV KIT
10.0000 | PACK | Freq: Once | INTRAVENOUS | Status: AC | PRN
  Administered 2011-03-14: 10 via INTRAVENOUS

## 2011-03-14 MED ORDER — TECHNETIUM TC 99M TETROFOSMIN IV KIT
30.0000 | PACK | Freq: Once | INTRAVENOUS | Status: AC | PRN
  Administered 2011-03-14: 30 via INTRAVENOUS

## 2011-03-14 MED ORDER — ADENOSINE (DIAGNOSTIC) 3 MG/ML IV SOLN
0.5600 mg/kg | Freq: Once | INTRAVENOUS | Status: AC
  Administered 2011-03-14: 36.6 mg via INTRAVENOUS

## 2011-03-14 NOTE — Progress Notes (Signed)
[facility] SITE 3 NUCLEAR MED [address] [place] [address] [phone]  Cardiology Nuclear Med Study  Jamie Reese is a 77 y.o. female [id number] 05/11/1933   Nuclear Med Background Indication for Stress Test:  Evaluation for Ischemia and Stent Patency History:  Asthma and '02 Stent>in stent RCA, EF=55%; '09 Echo:EF=55%; PTVP; '10 MPS:No ischemia, EF=76% Cardiac Risk Factors: Family History - CAD, Hypertension, LBBB and Lipids  Symptoms:  Chest Pressure>Neck and Jaw with and without Exertion (last episode of chest discomfort was last night), DOE and Rapid HR   Nuclear Pre-Procedure Caffeine/Decaff Intake:  None NPO After: 7:00pm   Lungs:  Clear.  O2 SAT 98% on RA IV 0.9% NS with Angio Cath:  20g  IV Site: R Antecubital  IV Started by:  Stanton Kidney, EMT-P  Chest Size (in):  38 Cup Size: B  Height: 5\' 1"  (1.549 m)  Weight:  144 lb (65.318 kg)  BMI:  Body mass index is 27.21 kg/(m^2). Tech Comments:  NA    Nuclear Med Study 1 or 2 day study: 1 day  Stress Test Type:  Adenosine  Reading MD: Dietrich Pates, MD  Order Authorizing Provider:  Rollene Rotunda, MD; Wyatt Mage, PA  Resting Radionuclide: Technetium 26m Tetrofosmin  Resting Radionuclide Dose: 11.0 mCi   Stress Radionuclide:  Technetium 33m Tetrofosmin  Stress Radionuclide Dose: 33.0 mCi           Stress Protocol Rest HR: 81 Stress HR: 109  Rest BP: 147/61 Stress BP: 124/61  Exercise Time (min): n/a METS: n/a   Predicted Max HR: 145 bpm % Max HR: 75.17 bpm Rate Pressure Product: 46962   Dose of Adenosine (mg):  36.7 Dose of Lexiscan: n/a mg  Dose of Atropine (mg): n/a Dose of Dobutamine: n/a mcg/kg/min (at max HR)  Stress Test Technologist: Smiley Houseman, CMA-N  Nuclear Technologist:  Domenic Polite, CNMT     Rest Procedure:  Myocardial perfusion imaging was performed at rest 45 minutes following the intravenous administration of Technetium 67m Tetrofosmin.  Rest ECG: AV paced  LBBB.  Stress Procedure:  The patient received IV adenosine at 140 mcg/kg/min for 4 minutes.  There were no significant changes with infusion.  She did c/o chest and throat tightness with infusion.  Technetium 61m Tetrofosmin was injected at the 2 minute mark and quantitative spect images were obtained after a 45 minute delay.  Stress ECG: No significant change from baseline ECG and Uninteretable due to baseline LBBB  QPS Raw Data Images:  Images were motion corrected.  Soft tissue (diaphragm, bowel activity, breast tissue) surround heart. Stress Images:  Defect in the inferoseptal wall (base, mid), inferior wall (base, mid, distal) and apex.  Otherwise normal perfusion. Rest Images:  Minimal change from the stress images. Subtraction (SDS):  No evidence of ischemia. Transient Ischemic Dilatation (Normal <1.22):  1.06 Lung/Heart Ratio (Normal <0.45):  0.35  Quantitative Gated Spect Images QGS EDV:  68 ml QGS ESV:  25 ml QGS cine images:  Mild inferior (mid/distal) and apical hypokinesis. QGS EF: 63%  Impression Exercise Capacity:  Adenosine study with no exercise. BP Response:  Normal blood pressure response. Clinical Symptoms:  Mild chest pain/dyspnea. ECG Impression:  Baseline:  LBBB.  EKG uninterpretable due to LBBB at rest and stress. Comparison with Prior Nuclear Study: No prior study to compare. 1562} Overall Impression:  Inferior/inferoseptal and apical defect consistent with scar and  soft tissue attenuation.  No ischemia.  LVEF 63% with wall motion  as noted. Dietrich Pates

## 2011-04-07 ENCOUNTER — Telehealth: Payer: Self-pay | Admitting: Cardiology

## 2011-04-07 DIAGNOSIS — T85698A Other mechanical complication of other specified internal prosthetic devices, implants and grafts, initial encounter: Secondary | ICD-10-CM | POA: Diagnosis not present

## 2011-04-07 DIAGNOSIS — H53459 Other localized visual field defect, unspecified eye: Secondary | ICD-10-CM | POA: Diagnosis not present

## 2011-04-07 DIAGNOSIS — Z961 Presence of intraocular lens: Secondary | ICD-10-CM | POA: Diagnosis not present

## 2011-04-07 DIAGNOSIS — T8529XA Other mechanical complication of intraocular lens, initial encounter: Secondary | ICD-10-CM | POA: Diagnosis not present

## 2011-04-07 NOTE — Telephone Encounter (Signed)
All Cardiac records faxed to [facility] @ [phone] 04/07/11/KM

## 2011-04-11 ENCOUNTER — Ambulatory Visit (INDEPENDENT_AMBULATORY_CARE_PROVIDER_SITE_OTHER): Payer: Medicare Other | Admitting: Cardiology

## 2011-04-11 ENCOUNTER — Encounter: Payer: Self-pay | Admitting: Cardiology

## 2011-04-11 DIAGNOSIS — I2510 Atherosclerotic heart disease of native coronary artery without angina pectoris: Secondary | ICD-10-CM

## 2011-04-11 DIAGNOSIS — Z950 Presence of cardiac pacemaker: Secondary | ICD-10-CM

## 2011-04-11 DIAGNOSIS — I10 Essential (primary) hypertension: Secondary | ICD-10-CM | POA: Diagnosis not present

## 2011-04-11 NOTE — Patient Instructions (Signed)
The current medical regimen is effective;  continue present plan and medications.  Follow up in 6 months with Dr Hochrein.  You will receive a letter in the mail 2 months before you are due.  Please call us when you receive this letter to schedule your follow up appointment.  

## 2011-04-11 NOTE — Assessment & Plan Note (Signed)
The blood pressure is at target. No change in medications is indicated. We will continue with therapeutic lifestyle changes (TLC).  

## 2011-04-11 NOTE — Assessment & Plan Note (Signed)
She had a low risk stress perfusion study. No further testing is indicated. She will continue with risk reduction.

## 2011-04-11 NOTE — Assessment & Plan Note (Signed)
We will follow this in our clinic.  She is up to date with device checks and has normal function at this point.

## 2011-04-11 NOTE — Progress Notes (Signed)
   HPI  Patient presents for followup of coronary disease. She had chest pain recently. She had a stress perfusion study. This demonstrated inferior/inferoseptal and apical defect consistent with scar and soft tissue attenuation. No ischemia. LVEF 63%.  Since then she started Hyla sac. She had resolution of her chest discomfort. She's somewhat limited in activities because of joint and back pains. However, with her household chores she's not getting any chest pressure, neck or arm discomfort. She's not noticing palpitations, presyncope or syncope. There has been no PND or orthopnea.      Allergies  Allergen Reactions  . Codeine   . Cymbalta (Duloxetine Hcl)   . Persantine     Possibly (?)  . Tramadol     Current Outpatient Prescriptions  Medication Sig Dispense Refill  . aspirin 81 MG tablet Take 81 mg by mouth daily.        Marland Kitchen atorvastatin (LIPITOR) 20 MG tablet Take 20 mg by mouth daily.        . ergocalciferol (VITAMIN D2) 50000 UNITS capsule Take 50,000 Units by mouth once a week.        . Multiple Vitamin (MULTIVITAMIN) tablet Take 1 tablet by mouth daily.        . traMADol (ULTRAM) 50 MG tablet Take 50 mg by mouth every 6 (six) hours as needed.          Past Medical History  Diagnosis Date  . Syncope   . Coronary artery disease   . Hypertension   . Hyperlipidemia   . Pacemaker     complete heart block; St. Jude PM   . Complete heart block   . Irritable bowel syndrome   . Polymyalgia rheumatica   . Giant cell arteritis   . H/O: hysterectomy   . S/P breast biopsy     secondary to nipple discharge    Past Surgical History  Procedure Date  . Appendectomy   . Tonsillectomy   . Cholecystectomy   . Breast biopsy     secondary to nipple discharge  . Pilondial cyst resection   . Cesarean section   . Temporal artery biopsy / ligation   . Pacemaker insertion     St. Jude Pacemaker  . Eye surgery     ROS:  As stated in the HPI and negative for all other  systems.  PHYSICAL EXAM BP 126/58  Pulse 75  Ht 5\' 1"  (1.549 m)  Wt 143 lb (64.864 kg)  BMI 27.02 kg/m2 GENERAL:  Well appearing NECK:  No jugular venous distention, waveform within normal limits, carotid upstroke brisk and symmetric, no bruits, no thyromegaly LYMPHATICS:  No cervical, inguinal adenopathy LUNGS:  Clear to auscultation bilaterally BACK: No CVA tenderness CHEST:  Pacemaker pocket HEART:  PMI not displaced or sustained,S1 and S2 within normal limits, no S3, no S4, no clicks, no rubs, no murmurs ABD:  Flat, positive bowel sounds normal in frequency in pitch, no bruits, no rebound, no guarding, no midline pulsatile mass, no hepatomegaly, no splenomegaly EXT:  2 plus pulses throughout, no edema, no cyanosis no clubbing  EKG:  Atrial ventricular paced rhythm 100% capture 04/11/2011  ASSESSMENT AND PLAN

## 2011-04-14 NOTE — Progress Notes (Signed)
Addended by: Judithe Modest D on: 04/14/2011 09:07 AM   Modules accepted: Orders

## 2011-06-21 DIAGNOSIS — H6120 Impacted cerumen, unspecified ear: Secondary | ICD-10-CM | POA: Diagnosis not present

## 2011-07-11 DIAGNOSIS — Z9849 Cataract extraction status, unspecified eye: Secondary | ICD-10-CM | POA: Insufficient documentation

## 2011-07-11 DIAGNOSIS — H18239 Secondary corneal edema, unspecified eye: Secondary | ICD-10-CM | POA: Insufficient documentation

## 2011-07-11 DIAGNOSIS — H3530 Unspecified macular degeneration: Secondary | ICD-10-CM | POA: Diagnosis not present

## 2011-08-08 ENCOUNTER — Telehealth: Payer: Self-pay | Admitting: Cardiology

## 2011-08-08 NOTE — Telephone Encounter (Signed)
08-08-11 lmm @ 433pm for pt to call to set up past due paer ck/mt

## 2011-08-22 ENCOUNTER — Other Ambulatory Visit: Payer: Self-pay | Admitting: Internal Medicine

## 2011-08-22 DIAGNOSIS — Z1231 Encounter for screening mammogram for malignant neoplasm of breast: Secondary | ICD-10-CM

## 2011-08-24 ENCOUNTER — Ambulatory Visit (INDEPENDENT_AMBULATORY_CARE_PROVIDER_SITE_OTHER): Payer: Medicare Other | Admitting: *Deleted

## 2011-08-24 ENCOUNTER — Encounter: Payer: Self-pay | Admitting: Cardiology

## 2011-08-24 DIAGNOSIS — I442 Atrioventricular block, complete: Secondary | ICD-10-CM

## 2011-08-24 LAB — PACEMAKER DEVICE OBSERVATION
ATRIAL PACING PM: 77
BAMS-0001: 150 {beats}/min
BAMS-0003: 70 {beats}/min
BATTERY VOLTAGE: 2.79 V
DEVICE MODEL PM: 1240201
VENTRICULAR PACING PM: 100

## 2011-08-24 NOTE — Progress Notes (Signed)
Pacer check

## 2011-09-12 ENCOUNTER — Ambulatory Visit
Admission: RE | Admit: 2011-09-12 | Discharge: 2011-09-12 | Disposition: A | Discharge status: Home / Self Care | Payer: Medicare Other | Source: Ambulatory Visit | Attending: Internal Medicine | Admitting: Internal Medicine

## 2011-09-12 DIAGNOSIS — Z1231 Encounter for screening mammogram for malignant neoplasm of breast: Secondary | ICD-10-CM | POA: Diagnosis not present

## 2011-09-14 ENCOUNTER — Other Ambulatory Visit: Payer: Self-pay | Admitting: Internal Medicine

## 2011-09-14 DIAGNOSIS — E785 Hyperlipidemia, unspecified: Secondary | ICD-10-CM | POA: Diagnosis not present

## 2011-09-14 DIAGNOSIS — R7301 Impaired fasting glucose: Secondary | ICD-10-CM | POA: Diagnosis not present

## 2011-09-14 DIAGNOSIS — E042 Nontoxic multinodular goiter: Secondary | ICD-10-CM | POA: Diagnosis not present

## 2011-09-14 DIAGNOSIS — R928 Other abnormal and inconclusive findings on diagnostic imaging of breast: Secondary | ICD-10-CM

## 2011-09-14 DIAGNOSIS — I259 Chronic ischemic heart disease, unspecified: Secondary | ICD-10-CM | POA: Diagnosis not present

## 2011-09-23 ENCOUNTER — Ambulatory Visit
Admission: RE | Admit: 2011-09-23 | Discharge: 2011-09-23 | Disposition: A | Discharge status: Home / Self Care | Payer: Medicare Other | Source: Ambulatory Visit | Attending: Internal Medicine | Admitting: Internal Medicine

## 2011-09-23 DIAGNOSIS — R928 Other abnormal and inconclusive findings on diagnostic imaging of breast: Secondary | ICD-10-CM

## 2012-01-11 DIAGNOSIS — H6120 Impacted cerumen, unspecified ear: Secondary | ICD-10-CM | POA: Diagnosis not present

## 2012-03-30 DIAGNOSIS — J029 Acute pharyngitis, unspecified: Secondary | ICD-10-CM | POA: Diagnosis not present

## 2012-03-30 DIAGNOSIS — Z1331 Encounter for screening for depression: Secondary | ICD-10-CM | POA: Diagnosis not present

## 2012-04-19 ENCOUNTER — Encounter: Payer: Self-pay | Admitting: Cardiology

## 2012-04-19 DIAGNOSIS — I259 Chronic ischemic heart disease, unspecified: Secondary | ICD-10-CM | POA: Diagnosis not present

## 2012-04-19 DIAGNOSIS — E559 Vitamin D deficiency, unspecified: Secondary | ICD-10-CM | POA: Diagnosis not present

## 2012-04-19 DIAGNOSIS — E042 Nontoxic multinodular goiter: Secondary | ICD-10-CM | POA: Diagnosis not present

## 2012-04-19 DIAGNOSIS — E785 Hyperlipidemia, unspecified: Secondary | ICD-10-CM | POA: Diagnosis not present

## 2012-04-19 DIAGNOSIS — R82998 Other abnormal findings in urine: Secondary | ICD-10-CM | POA: Diagnosis not present

## 2012-04-19 DIAGNOSIS — R7301 Impaired fasting glucose: Secondary | ICD-10-CM | POA: Diagnosis not present

## 2012-04-20 DIAGNOSIS — E785 Hyperlipidemia, unspecified: Secondary | ICD-10-CM | POA: Diagnosis not present

## 2012-04-24 DIAGNOSIS — I259 Chronic ischemic heart disease, unspecified: Secondary | ICD-10-CM | POA: Diagnosis not present

## 2012-04-24 DIAGNOSIS — H6120 Impacted cerumen, unspecified ear: Secondary | ICD-10-CM | POA: Diagnosis not present

## 2012-04-24 DIAGNOSIS — Z0000 Encounter for general adult medical examination without abnormal findings: Secondary | ICD-10-CM | POA: Diagnosis not present

## 2012-04-24 DIAGNOSIS — E042 Nontoxic multinodular goiter: Secondary | ICD-10-CM | POA: Diagnosis not present

## 2012-04-24 DIAGNOSIS — E785 Hyperlipidemia, unspecified: Secondary | ICD-10-CM | POA: Diagnosis not present

## 2012-04-25 DIAGNOSIS — Z1212 Encounter for screening for malignant neoplasm of rectum: Secondary | ICD-10-CM | POA: Diagnosis not present

## 2012-04-27 ENCOUNTER — Encounter: Payer: Self-pay | Admitting: Cardiology

## 2012-04-27 ENCOUNTER — Ambulatory Visit (INDEPENDENT_AMBULATORY_CARE_PROVIDER_SITE_OTHER): Payer: Medicare Other | Admitting: Cardiology

## 2012-04-27 VITALS — BP 130/62 | HR 74 | Ht 61.0 in | Wt 133.0 lb

## 2012-04-27 DIAGNOSIS — R55 Syncope and collapse: Secondary | ICD-10-CM | POA: Diagnosis not present

## 2012-04-27 DIAGNOSIS — Z950 Presence of cardiac pacemaker: Secondary | ICD-10-CM

## 2012-04-27 DIAGNOSIS — E785 Hyperlipidemia, unspecified: Secondary | ICD-10-CM

## 2012-04-27 DIAGNOSIS — I2510 Atherosclerotic heart disease of native coronary artery without angina pectoris: Secondary | ICD-10-CM | POA: Diagnosis not present

## 2012-04-27 DIAGNOSIS — I10 Essential (primary) hypertension: Secondary | ICD-10-CM | POA: Diagnosis not present

## 2012-04-27 LAB — PACEMAKER DEVICE OBSERVATION
AL IMPEDENCE PM: 442 Ohm
ATRIAL PACING PM: 79
BATTERY VOLTAGE: 2.78 V
DEVICE MODEL PM: 1240201
VENTRICULAR PACING PM: 99

## 2012-04-27 NOTE — Progress Notes (Signed)
HPI  Patient presents for followup of coronary disease. Since I last saw her she has done well.  The patient denies any new symptoms such as chest discomfort, neck or arm discomfort. There has been no new shortness of breath, PND or orthopnea. There has been no reported presyncope or syncope. She lost 10 pounds and is able to walk more than he was. She's quite proud of this.  She does occasionally feel some mild palpitations but these are nonsustained.    Allergies  Allergen Reactions  . Codeine   . Cymbalta (Duloxetine Hcl)   . Dipyridamole     Possibly (?)    Current Outpatient Prescriptions  Medication Sig Dispense Refill  . aspirin 81 MG tablet Take 81 mg by mouth daily.        Marland Kitchen atorvastatin (LIPITOR) 20 MG tablet Take 20 mg by mouth daily.        . ergocalciferol (VITAMIN D2) 50000 UNITS capsule Take 50,000 Units by mouth once a week.        . Multiple Vitamin (MULTIVITAMIN) tablet Take 1 tablet by mouth daily.        . traMADol (ULTRAM) 50 MG tablet Take 50 mg by mouth every 6 (six) hours as needed.          Past Medical History  Diagnosis Date  . Syncope   . Coronary artery disease   . Hypertension   . Hyperlipidemia   . Pacemaker     complete heart block; St. Jude PM   . Complete heart block   . Irritable bowel syndrome   . Polymyalgia rheumatica   . Giant cell arteritis   . H/O: hysterectomy   . S/P breast biopsy     secondary to nipple discharge    Past Surgical History  Procedure Date  . Appendectomy   . Tonsillectomy   . Cholecystectomy   . Breast biopsy     secondary to nipple discharge  . Pilondial cyst resection   . Cesarean section   . Temporal artery biopsy / ligation   . Pacemaker insertion     St. Jude Pacemaker  . Eye surgery     ROS:  As stated in the HPI and negative for all other systems.  PHYSICAL EXAM BP 130/62  Pulse 74  Ht 5\' 1"  (1.549 m)  Wt 133 lb (60.328 kg)  BMI 25.13 kg/m2 GENERAL:  Well appearing NECK:  No jugular  venous distention, waveform within normal limits, carotid upstroke brisk and symmetric, no bruits, no thyromegaly LYMPHATICS:  No cervical, inguinal adenopathy LUNGS:  Clear to auscultation bilaterally BACK: No CVA tenderness CHEST:  Pacemaker pocket HEART:  PMI not displaced or sustained,S1 and S2 within normal limits, no S3, no S4, no clicks, no rubs, no murmurs ABD:  Flat, positive bowel sounds normal in frequency in pitch, no bruits, no rebound, no guarding, no midline pulsatile mass, no hepatomegaly, no splenomegaly EXT:  2 plus pulses throughout, no edema, no cyanosis no clubbing  EKG:  Atrial ventricular paced rhythm 100% capture 04/27/2012  ASSESSMENT AND PLAN  CAD -  She had a low risk stress perfusion study in Dec 2012.  No further testing is indicated. She will continue with risk reduction.  HYPERTENSION -  The blood pressure is at target. No change in medications is indicated. We will continue with therapeutic lifestyle changes (TLC).   PACEMAKER, PERMANENT -  Her pacemaker was followed today.  There is normal function.  We will follow per  protocol.   PALPIT ATIONS - These are not particularly symptomatic. No change in therapy is indicated.

## 2012-08-20 ENCOUNTER — Other Ambulatory Visit: Payer: Self-pay

## 2012-08-20 DIAGNOSIS — Z1231 Encounter for screening mammogram for malignant neoplasm of breast: Secondary | ICD-10-CM

## 2012-08-21 DIAGNOSIS — H6120 Impacted cerumen, unspecified ear: Secondary | ICD-10-CM | POA: Diagnosis not present

## 2012-09-20 ENCOUNTER — Ambulatory Visit
Admission: RE | Admit: 2012-09-20 | Discharge: 2012-09-20 | Disposition: A | Discharge status: Home / Self Care | Payer: Medicare Other | Source: Ambulatory Visit

## 2012-09-20 DIAGNOSIS — Z1231 Encounter for screening mammogram for malignant neoplasm of breast: Secondary | ICD-10-CM | POA: Diagnosis not present

## 2012-10-24 ENCOUNTER — Ambulatory Visit (INDEPENDENT_AMBULATORY_CARE_PROVIDER_SITE_OTHER): Payer: Medicare Other | Admitting: *Deleted

## 2012-10-24 DIAGNOSIS — M1990 Unspecified osteoarthritis, unspecified site: Secondary | ICD-10-CM | POA: Diagnosis not present

## 2012-10-24 DIAGNOSIS — IMO0002 Reserved for concepts with insufficient information to code with codable children: Secondary | ICD-10-CM | POA: Diagnosis not present

## 2012-10-24 DIAGNOSIS — Z950 Presence of cardiac pacemaker: Secondary | ICD-10-CM

## 2012-10-24 DIAGNOSIS — R55 Syncope and collapse: Secondary | ICD-10-CM | POA: Diagnosis not present

## 2012-10-24 DIAGNOSIS — M353 Polymyalgia rheumatica: Secondary | ICD-10-CM | POA: Diagnosis not present

## 2012-10-24 DIAGNOSIS — I259 Chronic ischemic heart disease, unspecified: Secondary | ICD-10-CM | POA: Diagnosis not present

## 2012-10-24 DIAGNOSIS — R7301 Impaired fasting glucose: Secondary | ICD-10-CM | POA: Diagnosis not present

## 2012-10-24 NOTE — Progress Notes (Signed)
Device check in clinic.  All functions normal, no changes made, full details in PaceArt.  ROV w/ Dr. Antoine Poche in 6 months. Jamie Reese

## 2012-10-25 LAB — PACEMAKER DEVICE OBSERVATION
AL IMPEDENCE PM: 458 Ohm
ATRIAL PACING PM: 76
RV LEAD IMPEDENCE PM: 687 Ohm
RV LEAD THRESHOLD: 0.75 V

## 2013-04-18 ENCOUNTER — Encounter: Payer: Self-pay | Admitting: Cardiology

## 2013-04-22 ENCOUNTER — Encounter: Payer: Self-pay | Admitting: Cardiology

## 2013-04-22 DIAGNOSIS — I2510 Atherosclerotic heart disease of native coronary artery without angina pectoris: Secondary | ICD-10-CM | POA: Diagnosis not present

## 2013-04-22 DIAGNOSIS — R7301 Impaired fasting glucose: Secondary | ICD-10-CM | POA: Diagnosis not present

## 2013-04-22 DIAGNOSIS — E559 Vitamin D deficiency, unspecified: Secondary | ICD-10-CM | POA: Diagnosis not present

## 2013-04-22 DIAGNOSIS — E042 Nontoxic multinodular goiter: Secondary | ICD-10-CM | POA: Diagnosis not present

## 2013-04-22 DIAGNOSIS — R82998 Other abnormal findings in urine: Secondary | ICD-10-CM | POA: Diagnosis not present

## 2013-04-22 DIAGNOSIS — E785 Hyperlipidemia, unspecified: Secondary | ICD-10-CM | POA: Diagnosis not present

## 2013-04-24 ENCOUNTER — Encounter: Payer: Self-pay | Admitting: Internal Medicine

## 2013-04-24 ENCOUNTER — Ambulatory Visit (INDEPENDENT_AMBULATORY_CARE_PROVIDER_SITE_OTHER): Payer: Medicare Other | Admitting: Internal Medicine

## 2013-04-24 VITALS — BP 158/60 | HR 78 | Ht 61.0 in | Wt 127.0 lb

## 2013-04-24 DIAGNOSIS — I442 Atrioventricular block, complete: Secondary | ICD-10-CM

## 2013-04-24 DIAGNOSIS — I2510 Atherosclerotic heart disease of native coronary artery without angina pectoris: Secondary | ICD-10-CM | POA: Diagnosis not present

## 2013-04-24 DIAGNOSIS — I10 Essential (primary) hypertension: Secondary | ICD-10-CM

## 2013-04-24 DIAGNOSIS — Z950 Presence of cardiac pacemaker: Secondary | ICD-10-CM | POA: Diagnosis not present

## 2013-04-24 LAB — MDC_IDC_ENUM_SESS_TYPE_INCLINIC
Date Time Interrogation Session: 20150121101909
Implantable Pulse Generator Serial Number: 1240201
Lead Channel Impedance Value: 478 Ohm
Lead Channel Impedance Value: 650 Ohm
Lead Channel Pacing Threshold Amplitude: 0.625 V
Lead Channel Setting Sensing Sensitivity: 2 mV
MDC IDC MSMT BATTERY IMPEDANCE: 1800 Ohm
MDC IDC MSMT BATTERY VOLTAGE: 2.78 V
MDC IDC MSMT LEADCHNL RA PACING THRESHOLD AMPLITUDE: 0.5 V
MDC IDC MSMT LEADCHNL RA PACING THRESHOLD PULSEWIDTH: 0.5 ms
MDC IDC MSMT LEADCHNL RA SENSING INTR AMPL: 2.5 mV
MDC IDC MSMT LEADCHNL RV PACING THRESHOLD PULSEWIDTH: 0.5 ms
MDC IDC SET LEADCHNL RA PACING AMPLITUDE: 2 V
MDC IDC SET LEADCHNL RV PACING PULSEWIDTH: 0.5 ms
MDC IDC STAT BRADY RA PERCENT PACED: 78 %
MDC IDC STAT BRADY RV PERCENT PACED: 99 % — AB

## 2013-04-24 NOTE — Patient Instructions (Signed)
Your physician wants you to follow-up in: 6 months in the device clinic and 12 months with Dr Rayann Heman Dennis Bast will receive a reminder letter in the mail two months in advance. If you don't receive a letter, please call our office to schedule the follow-up appointment.

## 2013-04-24 NOTE — Progress Notes (Signed)
Jamie Ringer, MD: Primary Cardiologist:  Dr Maia Plan is a 80 y.o. female with a h/o complete heart block sp PPM (SJM) in [place] who presents today to establish care in the Electrophysiology device clinic.   The patient reports doing very well since having a pacemaker implanted and remains very active despite her age. She is primarily limited by PMR.  Today, she  denies symptoms of palpitations, chest pain, shortness of breath, orthopnea, PND, lower extremity edema, dizziness, presyncope, syncope, or neurologic sequela.  The patientis tolerating medications without difficulties and is otherwise without complaint today.   Past Medical History  Diagnosis Date  . Complete heart block     s/p PPM  . Coronary artery disease     s/p PCI  . Hypertension   . Hyperlipidemia   . Irritable bowel syndrome   . Polymyalgia rheumatica   . Giant cell arteritis   . H/O: hysterectomy   . S/P breast biopsy     secondary to nipple discharge   Past Surgical History  Procedure Laterality Date  . Appendectomy    . Tonsillectomy    . Cholecystectomy    . Breast biopsy      secondary to nipple discharge  . Pilondial cyst resection    . Cesarean section    . Temporal artery biopsy / ligation    . Pacemaker insertion  11/15/2007    St. Jude Pacemaker Zephyr XL implanted in [place]  . Eye surgery      History   Social History  . Marital Status: Married    Spouse Name: N/A    Number of Children: N/A  . Years of Education: N/A   Occupational History  . Not on file.   Social History Main Topics  . Smoking status: Never Smoker   . Smokeless tobacco: Never Used  . Alcohol Use: No  . Drug Use: No  . Sexual Activity: Not on file   Other Topics Concern  . Not on file   Social History Narrative  . No narrative on file    Family History  Problem Relation Age of Onset  . Cancer      Allergies  Allergen Reactions  . Codeine   . Cymbalta [Duloxetine Hcl]   .  Dipyridamole     Possibly (?)    Current Outpatient Prescriptions  Medication Sig Dispense Refill  . aspirin 81 MG tablet Take 81 mg by mouth daily.        Marland Kitchen atorvastatin (LIPITOR) 20 MG tablet Take 20 mg by mouth daily.        . ergocalciferol (VITAMIN D2) 50000 UNITS capsule Take 50,000 Units by mouth once a week.        . Multiple Vitamin (MULTIVITAMIN) tablet Take 1 tablet by mouth daily.        . traMADol (ULTRAM) 50 MG tablet Take 50 mg by mouth every 6 (six) hours as needed.         No current facility-administered medications for this visit.    ROS- all systems are reviewed and negative except as per HPI  Physical Exam: Filed Vitals:   04/24/13 0926  BP: 158/60  Pulse: 78  Height: 5\' 1"  (1.549 m)  Weight: 127 lb (57.607 kg)    GEN- The patient is well appearing, alert and oriented x 3 today.   Head- normocephalic, atraumatic Eyes-  Sclera clear, conjunctiva pink Ears- hearing intact Oropharynx- clear Neck- supple, no JVP Lymph- no  cervical lymphadenopathy Lungs- Clear to ausculation bilaterally, normal work of breathing Chest- pacemaker pocket is well healed Heart- Regular rate and rhythm, no murmurs, rubs or gallops, PMI not laterally displaced GI- soft, NT, ND, + BS Extremities- no clubbing, cyanosis, or edema MS- no significant deformity or atrophy Skin- no rash or lesion Psych- euthymic mood, full affect Neuro- strength and sensation are intact  Pacemaker interrogation- reviewed in detail today,  See PACEART report  Assessment and Plan:  1. Complete heart block Normal pacemaker function See Pace Art report No changes today  2. HTN Initial BP is 180/68, repeat by me is 158/60. She will follow-up with Dr Dagmar Hait next week and will be more willing to consider initiation of antihypertensive medicine at that time  3. CAD No ischemic symptoms  Return to the device clinic in 6 months Return to see me in 1 year Follow-up with Dr Percival Spanish as scheduled

## 2013-04-30 DIAGNOSIS — I447 Left bundle-branch block, unspecified: Secondary | ICD-10-CM | POA: Diagnosis not present

## 2013-04-30 DIAGNOSIS — E559 Vitamin D deficiency, unspecified: Secondary | ICD-10-CM | POA: Diagnosis not present

## 2013-04-30 DIAGNOSIS — IMO0002 Reserved for concepts with insufficient information to code with codable children: Secondary | ICD-10-CM | POA: Diagnosis not present

## 2013-04-30 DIAGNOSIS — I259 Chronic ischemic heart disease, unspecified: Secondary | ICD-10-CM | POA: Diagnosis not present

## 2013-04-30 DIAGNOSIS — Z0000 Encounter for general adult medical examination without abnormal findings: Secondary | ICD-10-CM | POA: Diagnosis not present

## 2013-04-30 DIAGNOSIS — M810 Age-related osteoporosis without current pathological fracture: Secondary | ICD-10-CM | POA: Diagnosis not present

## 2013-04-30 DIAGNOSIS — M1990 Unspecified osteoarthritis, unspecified site: Secondary | ICD-10-CM | POA: Diagnosis not present

## 2013-04-30 DIAGNOSIS — E042 Nontoxic multinodular goiter: Secondary | ICD-10-CM | POA: Diagnosis not present

## 2013-04-30 DIAGNOSIS — R7301 Impaired fasting glucose: Secondary | ICD-10-CM | POA: Diagnosis not present

## 2013-05-06 ENCOUNTER — Ambulatory Visit (INDEPENDENT_AMBULATORY_CARE_PROVIDER_SITE_OTHER): Payer: Medicare Other | Admitting: Cardiology

## 2013-05-06 ENCOUNTER — Encounter: Payer: Self-pay | Admitting: Cardiology

## 2013-05-06 VITALS — BP 146/60 | HR 70 | Wt 127.0 lb

## 2013-05-06 DIAGNOSIS — I10 Essential (primary) hypertension: Secondary | ICD-10-CM | POA: Diagnosis not present

## 2013-05-06 DIAGNOSIS — I2510 Atherosclerotic heart disease of native coronary artery without angina pectoris: Secondary | ICD-10-CM

## 2013-05-06 DIAGNOSIS — I442 Atrioventricular block, complete: Secondary | ICD-10-CM

## 2013-05-06 DIAGNOSIS — Z950 Presence of cardiac pacemaker: Secondary | ICD-10-CM

## 2013-05-06 DIAGNOSIS — H903 Sensorineural hearing loss, bilateral: Secondary | ICD-10-CM | POA: Diagnosis not present

## 2013-05-06 DIAGNOSIS — H9319 Tinnitus, unspecified ear: Secondary | ICD-10-CM | POA: Diagnosis not present

## 2013-05-06 NOTE — Patient Instructions (Signed)
The current medical regimen is effective;  continue present plan and medications.  Follow up in 18 months with Dr Percival Spanish.  You will receive a letter in the mail 2 months before you are due.  Please call us when you receive this letter to schedule your follow up appointment.  Jamie Reese [phone]

## 2013-05-06 NOTE — Progress Notes (Signed)
HPI  Patient presents for followup of coronary disease. Since I last saw her she has done OK from a cardiac standpoint although she is having pain possibly related to PMR.  She has pains in her chest and arms that she relates to musculoskeletal pain and not like her previous angina. She has not had any recurrence of this since stress testing in 2012., . There has been no new shortness of breath, PND or orthopnea. There has been no reported presyncope or syncope. She does occasionally feel some mild palpitations but these are nonsustained.    Allergies  Allergen Reactions  . Codeine   . Cymbalta [Duloxetine Hcl]   . Dipyridamole     Possibly (?)    Current Outpatient Prescriptions  Medication Sig Dispense Refill  . aspirin 81 MG tablet Take 81 mg by mouth daily.        Marland Kitchen atorvastatin (LIPITOR) 20 MG tablet Take 20 mg by mouth daily.        . ergocalciferol (VITAMIN D2) 50000 UNITS capsule Take 50,000 Units by mouth once a week.        . Multiple Vitamin (MULTIVITAMIN) tablet Take 1 tablet by mouth daily.        . traMADol (ULTRAM) 50 MG tablet Take 50 mg by mouth every 6 (six) hours as needed.         No current facility-administered medications for this visit.    Past Medical History  Diagnosis Date  . Complete heart block     s/p PPM  . Coronary artery disease     s/p PCI  . Hypertension   . Hyperlipidemia   . Irritable bowel syndrome   . Polymyalgia rheumatica   . Giant cell arteritis   . H/O: hysterectomy   . S/P breast biopsy     secondary to nipple discharge    Past Surgical History  Procedure Laterality Date  . Appendectomy    . Tonsillectomy    . Cholecystectomy    . Breast biopsy      secondary to nipple discharge  . Pilondial cyst resection    . Cesarean section    . Temporal artery biopsy / ligation    . Pacemaker insertion  11/15/2007    St. Jude Pacemaker Zephyr XL implanted in [place]  . Eye surgery      ROS:  As stated in the HPI and negative for all  other systems.  PHYSICAL EXAM BP 146/60  Pulse 70  Wt 127 lb (57.607 kg) GENERAL:  Well appearing NECK:  No jugular venous distention, waveform within normal limits, carotid upstroke brisk and symmetric, no bruits, no thyromegaly LYMPHATICS:  No cervical, inguinal adenopathy LUNGS:  Clear to auscultation bilaterally BACK: No CVA tenderness CHEST:  Pacemaker pocket HEART:  PMI not displaced or sustained,S1 and S2 within normal limits, no S3, no S4, no clicks, no rubs, no murmurs ABD:  Flat, positive bowel sounds normal in frequency in pitch, no bruits, no rebound, no guarding, no midline pulsatile mass, no hepatomegaly, no splenomegaly EXT:  2 plus pulses throughout, no edema, no cyanosis no clubbing  EKG:  Atrial ventricular paced rhythm. Premature ectopic complex.  05/06/2013  ASSESSMENT AND PLAN  CAD -  She had a low risk stress perfusion study in Dec 2012.  No further testing is indicated. She will continue with risk reduction.  HYPERTENSION -  The blood pressure is slightly elevated.  However, this is unusual.  No change in medications is indicated. We  will continue with therapeutic lifestyle changes (TLC).   PACEMAKER, PERMANENT -  She will continue to be followed in the Pacemaker clinic.   PALPIT ATIONS - These are not particularly symptomatic. No change in therapy is indicated.

## 2013-05-07 DIAGNOSIS — Z1212 Encounter for screening for malignant neoplasm of rectum: Secondary | ICD-10-CM | POA: Diagnosis not present

## 2013-05-07 DIAGNOSIS — M810 Age-related osteoporosis without current pathological fracture: Secondary | ICD-10-CM | POA: Diagnosis not present

## 2013-05-20 ENCOUNTER — Ambulatory Visit: Payer: Medicare Other | Admitting: Cardiology

## 2013-05-20 DIAGNOSIS — R82998 Other abnormal findings in urine: Secondary | ICD-10-CM | POA: Diagnosis not present

## 2013-05-20 DIAGNOSIS — R350 Frequency of micturition: Secondary | ICD-10-CM | POA: Diagnosis not present

## 2013-06-17 DIAGNOSIS — H3530 Unspecified macular degeneration: Secondary | ICD-10-CM | POA: Diagnosis not present

## 2013-06-17 DIAGNOSIS — H1820 Unspecified corneal edema: Secondary | ICD-10-CM | POA: Diagnosis not present

## 2013-06-17 DIAGNOSIS — H52209 Unspecified astigmatism, unspecified eye: Secondary | ICD-10-CM | POA: Diagnosis not present

## 2013-06-17 DIAGNOSIS — Z961 Presence of intraocular lens: Secondary | ICD-10-CM | POA: Diagnosis not present

## 2013-09-09 ENCOUNTER — Other Ambulatory Visit: Payer: Self-pay

## 2013-09-09 DIAGNOSIS — Z1231 Encounter for screening mammogram for malignant neoplasm of breast: Secondary | ICD-10-CM

## 2013-09-27 ENCOUNTER — Ambulatory Visit
Admission: RE | Admit: 2013-09-27 | Discharge: 2013-09-27 | Disposition: A | Discharge status: Home / Self Care | Payer: Medicare Other | Source: Ambulatory Visit

## 2013-09-27 DIAGNOSIS — Z1231 Encounter for screening mammogram for malignant neoplasm of breast: Secondary | ICD-10-CM | POA: Diagnosis not present

## 2013-10-23 ENCOUNTER — Ambulatory Visit (INDEPENDENT_AMBULATORY_CARE_PROVIDER_SITE_OTHER): Payer: Medicare Other | Admitting: *Deleted

## 2013-10-23 DIAGNOSIS — I442 Atrioventricular block, complete: Secondary | ICD-10-CM | POA: Diagnosis not present

## 2013-10-23 LAB — MDC_IDC_ENUM_SESS_TYPE_INCLINIC
Date Time Interrogation Session: 20150722092040
Implantable Pulse Generator Model: 5826
Implantable Pulse Generator Serial Number: 1240201
Lead Channel Impedance Value: 708 Ohm
Lead Channel Pacing Threshold Amplitude: 0.75 V
Lead Channel Setting Sensing Sensitivity: 2 mV
MDC IDC MSMT BATTERY IMPEDANCE: 2200 Ohm
MDC IDC MSMT BATTERY VOLTAGE: 2.76 V
MDC IDC MSMT LEADCHNL RA IMPEDANCE VALUE: 424 Ohm
MDC IDC MSMT LEADCHNL RA PACING THRESHOLD AMPLITUDE: 0.5 V
MDC IDC MSMT LEADCHNL RA PACING THRESHOLD PULSEWIDTH: 0.5 ms
MDC IDC MSMT LEADCHNL RA SENSING INTR AMPL: 2 mV
MDC IDC MSMT LEADCHNL RV PACING THRESHOLD PULSEWIDTH: 0.5 ms
MDC IDC SET LEADCHNL RA PACING AMPLITUDE: 2 V
MDC IDC SET LEADCHNL RV PACING PULSEWIDTH: 0.5 ms
MDC IDC STAT BRADY RA PERCENT PACED: 80 %
MDC IDC STAT BRADY RV PERCENT PACED: 100 %

## 2013-10-23 NOTE — Progress Notes (Signed)
Pacemaker check in clinic. Normal device function. Thresholds, sensing, impedances consistent with previous measurements. Device programmed to maximize longevity. 1 mode switch 4 seconds.  No high ventricular rates noted. Device programmed at appropriate safety margins. Histogram distribution appropriate for patient activity level. Device programmed to optimize intrinsic conduction. Estimated longevity 3.5 years.  Patient education completed.  ROV 6 months with Dr. Rayann Heman.

## 2013-10-28 DIAGNOSIS — I447 Left bundle-branch block, unspecified: Secondary | ICD-10-CM | POA: Diagnosis not present

## 2013-10-28 DIAGNOSIS — IMO0002 Reserved for concepts with insufficient information to code with codable children: Secondary | ICD-10-CM | POA: Diagnosis not present

## 2013-10-28 DIAGNOSIS — L989 Disorder of the skin and subcutaneous tissue, unspecified: Secondary | ICD-10-CM | POA: Diagnosis not present

## 2013-10-28 DIAGNOSIS — R7301 Impaired fasting glucose: Secondary | ICD-10-CM | POA: Diagnosis not present

## 2013-10-28 DIAGNOSIS — M353 Polymyalgia rheumatica: Secondary | ICD-10-CM | POA: Diagnosis not present

## 2013-10-28 DIAGNOSIS — Z1331 Encounter for screening for depression: Secondary | ICD-10-CM | POA: Diagnosis not present

## 2013-10-28 DIAGNOSIS — E559 Vitamin D deficiency, unspecified: Secondary | ICD-10-CM | POA: Diagnosis not present

## 2013-10-28 DIAGNOSIS — M810 Age-related osteoporosis without current pathological fracture: Secondary | ICD-10-CM | POA: Diagnosis not present

## 2013-10-29 DIAGNOSIS — Z1212 Encounter for screening for malignant neoplasm of rectum: Secondary | ICD-10-CM | POA: Diagnosis not present

## 2013-10-31 DIAGNOSIS — D485 Neoplasm of uncertain behavior of skin: Secondary | ICD-10-CM | POA: Diagnosis not present

## 2013-10-31 DIAGNOSIS — L819 Disorder of pigmentation, unspecified: Secondary | ICD-10-CM | POA: Diagnosis not present

## 2013-10-31 DIAGNOSIS — K130 Diseases of lips: Secondary | ICD-10-CM | POA: Diagnosis not present

## 2013-10-31 DIAGNOSIS — C4441 Basal cell carcinoma of skin of scalp and neck: Secondary | ICD-10-CM | POA: Diagnosis not present

## 2013-11-01 ENCOUNTER — Encounter: Payer: Self-pay | Admitting: Cardiology

## 2013-12-12 DIAGNOSIS — C44319 Basal cell carcinoma of skin of other parts of face: Secondary | ICD-10-CM | POA: Diagnosis not present

## 2013-12-12 DIAGNOSIS — Z85828 Personal history of other malignant neoplasm of skin: Secondary | ICD-10-CM | POA: Diagnosis not present

## 2014-01-01 DIAGNOSIS — K589 Irritable bowel syndrome without diarrhea: Secondary | ICD-10-CM | POA: Diagnosis not present

## 2014-01-01 DIAGNOSIS — K449 Diaphragmatic hernia without obstruction or gangrene: Secondary | ICD-10-CM | POA: Diagnosis not present

## 2014-01-01 DIAGNOSIS — R109 Unspecified abdominal pain: Secondary | ICD-10-CM | POA: Diagnosis not present

## 2014-01-01 DIAGNOSIS — R809 Proteinuria, unspecified: Secondary | ICD-10-CM | POA: Diagnosis not present

## 2014-01-01 DIAGNOSIS — R82998 Other abnormal findings in urine: Secondary | ICD-10-CM | POA: Diagnosis not present

## 2014-01-01 DIAGNOSIS — IMO0002 Reserved for concepts with insufficient information to code with codable children: Secondary | ICD-10-CM | POA: Diagnosis not present

## 2014-04-25 ENCOUNTER — Encounter: Payer: Medicare Other | Admitting: Internal Medicine

## 2014-04-30 DIAGNOSIS — R7302 Impaired glucose tolerance (oral): Secondary | ICD-10-CM | POA: Diagnosis not present

## 2014-04-30 DIAGNOSIS — I2510 Atherosclerotic heart disease of native coronary artery without angina pectoris: Secondary | ICD-10-CM | POA: Diagnosis not present

## 2014-04-30 DIAGNOSIS — R8299 Other abnormal findings in urine: Secondary | ICD-10-CM | POA: Diagnosis not present

## 2014-04-30 DIAGNOSIS — E042 Nontoxic multinodular goiter: Secondary | ICD-10-CM | POA: Diagnosis not present

## 2014-04-30 DIAGNOSIS — E559 Vitamin D deficiency, unspecified: Secondary | ICD-10-CM | POA: Diagnosis not present

## 2014-04-30 DIAGNOSIS — E785 Hyperlipidemia, unspecified: Secondary | ICD-10-CM | POA: Diagnosis not present

## 2014-05-02 DIAGNOSIS — I447 Left bundle-branch block, unspecified: Secondary | ICD-10-CM | POA: Diagnosis not present

## 2014-05-02 DIAGNOSIS — Z6824 Body mass index (BMI) 24.0-24.9, adult: Secondary | ICD-10-CM | POA: Diagnosis not present

## 2014-05-02 DIAGNOSIS — M353 Polymyalgia rheumatica: Secondary | ICD-10-CM | POA: Diagnosis not present

## 2014-05-02 DIAGNOSIS — R7302 Impaired glucose tolerance (oral): Secondary | ICD-10-CM | POA: Diagnosis not present

## 2014-05-02 DIAGNOSIS — L989 Disorder of the skin and subcutaneous tissue, unspecified: Secondary | ICD-10-CM | POA: Diagnosis not present

## 2014-05-02 DIAGNOSIS — M810 Age-related osteoporosis without current pathological fracture: Secondary | ICD-10-CM | POA: Diagnosis not present

## 2014-05-02 DIAGNOSIS — E785 Hyperlipidemia, unspecified: Secondary | ICD-10-CM | POA: Diagnosis not present

## 2014-05-02 DIAGNOSIS — H9190 Unspecified hearing loss, unspecified ear: Secondary | ICD-10-CM | POA: Diagnosis not present

## 2014-05-02 DIAGNOSIS — E559 Vitamin D deficiency, unspecified: Secondary | ICD-10-CM | POA: Diagnosis not present

## 2014-05-02 DIAGNOSIS — Z008 Encounter for other general examination: Secondary | ICD-10-CM | POA: Diagnosis not present

## 2014-05-02 DIAGNOSIS — E042 Nontoxic multinodular goiter: Secondary | ICD-10-CM | POA: Diagnosis not present

## 2014-05-02 DIAGNOSIS — Z1389 Encounter for screening for other disorder: Secondary | ICD-10-CM | POA: Diagnosis not present

## 2014-05-07 ENCOUNTER — Encounter: Payer: Self-pay | Admitting: Cardiology

## 2014-05-08 DIAGNOSIS — Z1212 Encounter for screening for malignant neoplasm of rectum: Secondary | ICD-10-CM | POA: Diagnosis not present

## 2014-05-14 ENCOUNTER — Encounter: Payer: Self-pay | Admitting: Internal Medicine

## 2014-05-14 ENCOUNTER — Ambulatory Visit (INDEPENDENT_AMBULATORY_CARE_PROVIDER_SITE_OTHER): Payer: Medicare Other | Admitting: Internal Medicine

## 2014-05-14 VITALS — BP 140/62 | HR 72 | Ht 60.5 in | Wt 127.8 lb

## 2014-05-14 DIAGNOSIS — I10 Essential (primary) hypertension: Secondary | ICD-10-CM | POA: Diagnosis not present

## 2014-05-14 DIAGNOSIS — I442 Atrioventricular block, complete: Secondary | ICD-10-CM

## 2014-05-14 DIAGNOSIS — Z950 Presence of cardiac pacemaker: Secondary | ICD-10-CM

## 2014-05-14 LAB — MDC_IDC_ENUM_SESS_TYPE_INCLINIC
Battery Impedance: 2500 Ohm
Battery Voltage: 2.76 V
Brady Statistic RA Percent Paced: 82 %
Brady Statistic RV Percent Paced: 99 %
Date Time Interrogation Session: 20160210161302
Lead Channel Pacing Threshold Amplitude: 0.5 V
Lead Channel Pacing Threshold Pulse Width: 0.5 ms
Lead Channel Sensing Intrinsic Amplitude: 2.4 mV
Lead Channel Setting Pacing Pulse Width: 0.5 ms
Lead Channel Setting Sensing Sensitivity: 2 mV
MDC IDC MSMT LEADCHNL RA IMPEDANCE VALUE: 475 Ohm
MDC IDC MSMT LEADCHNL RV IMPEDANCE VALUE: 735 Ohm
MDC IDC MSMT LEADCHNL RV PACING THRESHOLD AMPLITUDE: 0.625 V
MDC IDC MSMT LEADCHNL RV PACING THRESHOLD PULSEWIDTH: 0.5 ms
MDC IDC PG SERIAL: 1240201

## 2014-05-14 NOTE — Patient Instructions (Addendum)
Your physician wants you to follow-up in: 6 months with Dr Percival Spanish and device check the same day and 12 months with Chanetta Marshall, NP You will receive a reminder letter in the mail two months in advance. If you don't receive a letter, please call our office to schedule the follow-up appointment.

## 2014-05-15 NOTE — Progress Notes (Signed)
Jamie Ringer, MD: Primary Cardiologist:  Dr Maia Plan is a 81 y.o. female with a h/o complete heart block sp PPM (SJM) in [place] who presents today to follow-up in the Electrophysiology device clinic.   The patient reports doing very well since having a pacemaker implanted and remains very active despite her age. She has chronic difficulty with arthritis/ PMR.  Today, she  denies symptoms of palpitations, chest pain, shortness of breath, orthopnea, PND, lower extremity edema, dizziness, presyncope, syncope, or neurologic sequela.  The patientis tolerating medications without difficulties and is otherwise without complaint today.   Past Medical History  Diagnosis Date  . Complete heart block     s/p PPM  . Coronary artery disease     s/p PCI  . Hypertension   . Hyperlipidemia   . Irritable bowel syndrome   . Polymyalgia rheumatica   . Giant cell arteritis   . H/O: hysterectomy   . S/P breast biopsy     secondary to nipple discharge   Past Surgical History  Procedure Laterality Date  . Appendectomy    . Tonsillectomy    . Cholecystectomy    . Breast biopsy      secondary to nipple discharge  . Pilondial cyst resection    . Cesarean section    . Temporal artery biopsy / ligation    . Pacemaker insertion  11/15/2007    St. Jude Pacemaker Zephyr XL implanted in [place]  . Eye surgery      History   Social History  . Marital Status: Married    Spouse Name: N/A  . Number of Children: N/A  . Years of Education: N/A   Occupational History  . Not on file.   Social History Main Topics  . Smoking status: Never Smoker   . Smokeless tobacco: Never Used  . Alcohol Use: No  . Drug Use: No  . Sexual Activity: Not on file   Other Topics Concern  . Not on file   Social History Narrative    Family History  Problem Relation Age of Onset  . Cancer      Allergies  Allergen Reactions  . Codeine Nausea And Vomiting    Also passed out  . Cymbalta  [Duloxetine Hcl] Nausea And Vomiting    Also passed out  . Dipyridamole     UNKNOWN Possibly (?)    Current Outpatient Prescriptions  Medication Sig Dispense Refill  . aspirin 81 MG tablet Take 81 mg by mouth daily.      Marland Kitchen atorvastatin (LIPITOR) 20 MG tablet Take 20 mg by mouth daily.      . ergocalciferol (VITAMIN D2) 50000 UNITS capsule Take 50,000 Units by mouth once a week.      . Multiple Vitamin (MULTIVITAMIN) tablet Take 1 tablet by mouth daily.      . Multiple Vitamins-Minerals (PRESERVISION/LUTEIN PO) Take 1 capsule by mouth 2 (two) times daily.     . traMADol (ULTRAM) 50 MG tablet Take 50 mg by mouth every 6 (six) hours as needed (pain).      No current facility-administered medications for this visit.    ROS- + hearing changes and chronic vision loss, + easy bruising and joint swelling.  all other systems are reviewed and negative except as per HPI  Physical Exam: Filed Vitals:   05/14/14 1555  BP: 140/62  Pulse: 72  Height: 5' 0.5" (1.537 m)  Weight: 127 lb 12.8 oz (57.97 kg)  GEN- The patient is elderly appearing, alert and oriented x 3 today.   Head- normocephalic, atraumatic Eyes-  Sclera clear, conjunctiva pink Ears- hearing intact Oropharynx- clear Neck- supple,  Lungs- Clear to ausculation bilaterally, normal work of breathing Chest- pacemaker pocket is well healed Heart- Regular rate and rhythm, no murmurs, rubs or gallops, PMI not laterally displaced GI- soft, NT, ND, + BS Extremities- no clubbing, cyanosis, or edema MS- age appropriate atrophy Skin- no rash or lesion Psych- euthymic mood, full affect Neuro- strength and sensation are intact  Pacemaker interrogation- reviewed in detail today,  See PACEART report  Assessment and Plan:  1. Complete heart block Normal pacemaker function See Pace Art report No changes today  2. HTN Stable No change required today  3. CAD No ischemic symptoms  Return to see Dr Percival Spanish in 6  months Follow-up with Chanetta Marshall NP in 1 year

## 2014-06-19 DIAGNOSIS — R1900 Intra-abdominal and pelvic swelling, mass and lump, unspecified site: Secondary | ICD-10-CM | POA: Diagnosis not present

## 2014-06-19 DIAGNOSIS — Z01419 Encounter for gynecological examination (general) (routine) without abnormal findings: Secondary | ICD-10-CM | POA: Diagnosis not present

## 2014-06-20 DIAGNOSIS — H5212 Myopia, left eye: Secondary | ICD-10-CM | POA: Diagnosis not present

## 2014-06-20 DIAGNOSIS — Z961 Presence of intraocular lens: Secondary | ICD-10-CM | POA: Diagnosis not present

## 2014-06-20 DIAGNOSIS — H3531 Nonexudative age-related macular degeneration: Secondary | ICD-10-CM | POA: Diagnosis not present

## 2014-07-08 DIAGNOSIS — N8320 Unspecified ovarian cysts: Secondary | ICD-10-CM | POA: Diagnosis not present

## 2014-08-25 ENCOUNTER — Other Ambulatory Visit: Payer: Self-pay

## 2014-08-25 DIAGNOSIS — Z1231 Encounter for screening mammogram for malignant neoplasm of breast: Secondary | ICD-10-CM

## 2014-09-29 ENCOUNTER — Ambulatory Visit: Payer: No Typology Code available for payment source

## 2014-10-13 ENCOUNTER — Ambulatory Visit: Payer: No Typology Code available for payment source

## 2014-10-14 DIAGNOSIS — R300 Dysuria: Secondary | ICD-10-CM | POA: Diagnosis not present

## 2014-10-14 DIAGNOSIS — N390 Urinary tract infection, site not specified: Secondary | ICD-10-CM | POA: Diagnosis not present

## 2014-10-14 DIAGNOSIS — R8299 Other abnormal findings in urine: Secondary | ICD-10-CM | POA: Diagnosis not present

## 2014-10-30 ENCOUNTER — Encounter: Payer: Self-pay | Admitting: Cardiology

## 2014-11-03 DIAGNOSIS — Z9861 Coronary angioplasty status: Secondary | ICD-10-CM | POA: Diagnosis not present

## 2014-11-03 DIAGNOSIS — Z6823 Body mass index (BMI) 23.0-23.9, adult: Secondary | ICD-10-CM | POA: Diagnosis not present

## 2014-11-03 DIAGNOSIS — R7302 Impaired glucose tolerance (oral): Secondary | ICD-10-CM | POA: Diagnosis not present

## 2014-11-03 DIAGNOSIS — K589 Irritable bowel syndrome without diarrhea: Secondary | ICD-10-CM | POA: Diagnosis not present

## 2014-11-03 DIAGNOSIS — K449 Diaphragmatic hernia without obstruction or gangrene: Secondary | ICD-10-CM | POA: Diagnosis not present

## 2014-11-03 DIAGNOSIS — E042 Nontoxic multinodular goiter: Secondary | ICD-10-CM | POA: Diagnosis not present

## 2014-11-03 DIAGNOSIS — M1990 Unspecified osteoarthritis, unspecified site: Secondary | ICD-10-CM | POA: Diagnosis not present

## 2014-11-10 ENCOUNTER — Ambulatory Visit: Payer: No Typology Code available for payment source | Admitting: Cardiology

## 2014-11-11 ENCOUNTER — Ambulatory Visit: Payer: No Typology Code available for payment source

## 2014-11-13 ENCOUNTER — Ambulatory Visit
Admission: RE | Admit: 2014-11-13 | Discharge: 2014-11-13 | Disposition: A | Discharge status: Home / Self Care | Payer: Medicare Other | Source: Ambulatory Visit

## 2014-11-13 DIAGNOSIS — Z1231 Encounter for screening mammogram for malignant neoplasm of breast: Secondary | ICD-10-CM

## 2014-11-28 ENCOUNTER — Encounter: Payer: Self-pay | Admitting: *Deleted

## 2014-12-15 ENCOUNTER — Ambulatory Visit (INDEPENDENT_AMBULATORY_CARE_PROVIDER_SITE_OTHER): Payer: Medicare Other | Admitting: *Deleted

## 2014-12-15 DIAGNOSIS — I442 Atrioventricular block, complete: Secondary | ICD-10-CM | POA: Diagnosis not present

## 2014-12-15 DIAGNOSIS — Z950 Presence of cardiac pacemaker: Secondary | ICD-10-CM | POA: Diagnosis not present

## 2014-12-15 LAB — CUP PACEART INCLINIC DEVICE CHECK
Brady Statistic RA Percent Paced: 87 %
Brady Statistic RV Percent Paced: 99 %
Lead Channel Impedance Value: 682 Ohm
Lead Channel Pacing Threshold Amplitude: 0.5 V
Lead Channel Pacing Threshold Amplitude: 0.75 V
Lead Channel Pacing Threshold Pulse Width: 0.5 ms
Lead Channel Sensing Intrinsic Amplitude: 2.1 mV
MDC IDC MSMT BATTERY IMPEDANCE: 3000 Ohm
MDC IDC MSMT BATTERY VOLTAGE: 2.75 V
MDC IDC MSMT LEADCHNL RA IMPEDANCE VALUE: 499 Ohm
MDC IDC MSMT LEADCHNL RA PACING THRESHOLD PULSEWIDTH: 0.5 ms
MDC IDC SESS DTM: 20160912121917
MDC IDC SET LEADCHNL RV PACING PULSEWIDTH: 0.5 ms
MDC IDC SET LEADCHNL RV SENSING SENSITIVITY: 2 mV
Pulse Gen Model: 5826
Pulse Gen Serial Number: 1240201

## 2014-12-15 NOTE — Progress Notes (Signed)
Pacemaker check in clinic. Normal device function. Thresholds, sensing, impedances consistent with previous measurements. Device programmed to maximize longevity. No mode switch or high ventricular rates noted. Device programmed at appropriate safety margins. Histogram distribution appropriate for patient activity level. Device programmed to optimize intrinsic conduction. Estimated longevity 2.75-3.5 years. ROV with JA in February.

## 2014-12-26 ENCOUNTER — Encounter: Payer: Self-pay | Admitting: Internal Medicine

## 2015-01-05 ENCOUNTER — Encounter: Payer: Self-pay | Admitting: Cardiology

## 2015-01-05 ENCOUNTER — Ambulatory Visit (INDEPENDENT_AMBULATORY_CARE_PROVIDER_SITE_OTHER): Payer: Medicare Other | Admitting: Cardiology

## 2015-01-05 VITALS — BP 144/66 | HR 72 | Ht 61.0 in | Wt 124.4 lb

## 2015-01-05 DIAGNOSIS — I2510 Atherosclerotic heart disease of native coronary artery without angina pectoris: Secondary | ICD-10-CM | POA: Diagnosis not present

## 2015-01-05 DIAGNOSIS — I442 Atrioventricular block, complete: Secondary | ICD-10-CM | POA: Diagnosis not present

## 2015-01-05 NOTE — Progress Notes (Signed)
HPI  Patient presents for followup of coronary disease. Since I last saw her she has done OK from a cardiac standpoint although she is having pain possibly related to cervical spine problems.  She has trouble using her right shoulder.  She has not had any recurrence of this since stress testing in 2012. There has been no new shortness of breath, PND or orthopnea. There has been no reported presyncope or syncope.  She does housework and walks.    Allergies  Allergen Reactions  . Codeine Nausea And Vomiting    Also passed out  . Cymbalta [Duloxetine Hcl] Nausea And Vomiting    Also passed out  . Dipyridamole     UNKNOWN Possibly (?)    Current Outpatient Prescriptions  Medication Sig Dispense Refill  . aspirin 81 MG tablet Take 81 mg by mouth daily.      Marland Kitchen atorvastatin (LIPITOR) 20 MG tablet Take 20 mg by mouth daily.      . ergocalciferol (VITAMIN D2) 50000 UNITS capsule Take 50,000 Units by mouth once a week.      . Multiple Vitamins-Minerals (PRESERVISION AREDS PO) Take 1 tablet by mouth daily.    . traMADol (ULTRAM) 50 MG tablet Take 50 mg by mouth every 6 (six) hours as needed (pain).      No current facility-administered medications for this visit.    Past Medical History  Diagnosis Date  . Complete heart block (HCC)     s/p PPM  . Coronary artery disease     s/p PCI  . Hypertension   . Hyperlipidemia   . Irritable bowel syndrome   . Polymyalgia rheumatica ([place])   . Giant cell arteritis ([place])   . H/O: hysterectomy   . S/P breast biopsy     secondary to nipple discharge    Past Surgical History  Procedure Laterality Date  . Appendectomy    . Tonsillectomy    . Cholecystectomy    . Breast biopsy      secondary to nipple discharge  . Pilondial cyst resection    . Cesarean section    . Temporal artery biopsy / ligation    . Pacemaker insertion  11/15/2007    St. Jude Pacemaker Zephyr XL implanted in [place]  . Eye surgery      ROS:  As stated in the HPI and  negative for all other systems.  PHYSICAL EXAM BP 144/66 mmHg  Pulse 72  Ht 5\' 1"  (1.549 m)  Wt 124 lb 6.4 oz (56.427 kg)  BMI 23.52 kg/m2 GENERAL:  Well appearing NECK:  No jugular venous distention, waveform within normal limits, carotid upstroke brisk and symmetric, no bruits, no thyromegaly LUNGS:  Clear to auscultation bilaterally BACK: No CVA tenderness CHEST:  Pacemaker pocket HEART:  PMI not displaced or sustained,S1 and S2 within normal limits, no S3, no S4, no clicks, no rubs, no murmurs ABD:  Flat, positive bowel sounds normal in frequency in pitch, no bruits, no rebound, no guarding, no midline pulsatile mass, no hepatomegaly, no splenomegaly EXT:  2 plus pulses throughout, no edema, no cyanosis no clubbing   ASSESSMENT AND PLAN  CAD -  She had a low risk stress perfusion study in Dec 2012.  No further testing is indicated. She will continue with risk reduction.  HYPERTENSION -  The blood pressure is at target for her age.  No change in medications is indicated. We will continue with therapeutic lifestyle changes (TLC).   PACEMAKER, PERMANENT -  She will continue to be followed in the Pacemaker clinic.   PALPIT ATIONS - These are not particularly symptomatic. No change in therapy is indicated.  RISK REDUCTION - She had an excellent lipid profile in January.  No change in therapy is indicated.

## 2015-01-05 NOTE — Patient Instructions (Signed)
Your physician wants you to follow-up in: 1 Year. You will receive a reminder letter in the mail two months in advance. If you don't receive a letter, please call our office to schedule the follow-up appointment.  Kary Kos and Earle Gell- Spine surgery

## 2015-04-24 ENCOUNTER — Telehealth: Payer: Self-pay | Admitting: Cardiology

## 2015-04-24 DIAGNOSIS — M25511 Pain in right shoulder: Secondary | ICD-10-CM

## 2015-04-24 NOTE — Telephone Encounter (Signed)
Pt said Dr Warren Lacy told her she needs to see a spine doctor. She called Dr Tilden Fossa said she needs a referral. The fax number is [phone].

## 2015-04-24 NOTE — Telephone Encounter (Signed)
Spoke with pt, aware referral placed and sent to admin pool for scheduling

## 2015-04-28 ENCOUNTER — Other Ambulatory Visit: Payer: Self-pay | Admitting: Neurosurgery

## 2015-04-28 DIAGNOSIS — M5023 Other cervical disc displacement, cervicothoracic region: Secondary | ICD-10-CM

## 2015-04-28 DIAGNOSIS — M542 Cervicalgia: Secondary | ICD-10-CM | POA: Diagnosis not present

## 2015-04-28 DIAGNOSIS — M5416 Radiculopathy, lumbar region: Secondary | ICD-10-CM | POA: Diagnosis not present

## 2015-04-30 DIAGNOSIS — M19011 Primary osteoarthritis, right shoulder: Secondary | ICD-10-CM | POA: Diagnosis not present

## 2015-05-01 ENCOUNTER — Other Ambulatory Visit: Payer: Self-pay | Admitting: Specialist

## 2015-05-01 DIAGNOSIS — M19011 Primary osteoarthritis, right shoulder: Secondary | ICD-10-CM

## 2015-05-08 ENCOUNTER — Ambulatory Visit
Admission: RE | Admit: 2015-05-08 | Discharge: 2015-05-08 | Disposition: A | Discharge status: Home / Self Care | Payer: Medicare Other | Source: Ambulatory Visit | Attending: Neurosurgery | Admitting: Neurosurgery

## 2015-05-08 ENCOUNTER — Ambulatory Visit
Admission: RE | Admit: 2015-05-08 | Discharge: 2015-05-08 | Disposition: A | Discharge status: Home / Self Care | Payer: Medicare Other | Source: Ambulatory Visit | Attending: Specialist | Admitting: Specialist

## 2015-05-08 DIAGNOSIS — M19011 Primary osteoarthritis, right shoulder: Secondary | ICD-10-CM

## 2015-05-08 DIAGNOSIS — M5023 Other cervical disc displacement, cervicothoracic region: Secondary | ICD-10-CM

## 2015-05-08 DIAGNOSIS — R938 Abnormal findings on diagnostic imaging of other specified body structures: Secondary | ICD-10-CM | POA: Diagnosis not present

## 2015-05-08 DIAGNOSIS — M4806 Spinal stenosis, lumbar region: Secondary | ICD-10-CM | POA: Diagnosis not present

## 2015-05-08 DIAGNOSIS — M4802 Spinal stenosis, cervical region: Secondary | ICD-10-CM | POA: Diagnosis not present

## 2015-05-08 MED ORDER — IOHEXOL 300 MG/ML  SOLN: 10.0000 mL | Freq: Once | INTRAMUSCULAR | Status: DC | PRN

## 2015-05-08 NOTE — Discharge Instructions (Signed)
Myelogram Discharge Instructions  1. Go home and rest quietly for the next 24 hours.  It is important to lie flat for the next 24 hours.  Get up only to go to the restroom.  You may lie in the bed or on a couch on your back, your stomach, your left side or your right side.  You may have one pillow under your head.  You may have pillows between your knees while you are on your side or under your knees while you are on your back.  2. DO NOT drive today.  Recline the seat as far back as it will go, while still wearing your seat belt, on the way home.  3. You may get up to go to the bathroom as needed.  You may sit up for 10 minutes to eat.  You may resume your normal diet and medications unless otherwise indicated.  Drink lots of extra fluids today and tomorrow.  4. The incidence of headache, nausea, or vomiting is about 5% (one in 20 patients).  If you develop a headache, lie flat and drink plenty of fluids until the headache goes away.  Caffeinated beverages may be helpful.  If you develop severe nausea and vomiting or a headache that does not go away with flat bed rest, call [phone].  5. You may resume normal activities after your 24 hours of bed rest is over; however, do not exert yourself strongly or do any heavy lifting tomorrow. If when you get up you have a headache when standing, go back to bed and force fluids for another 24 hours.  6. Call your physician for a follow-up appointment.  The results of your myelogram will be sent directly to your physician by the following day.  7. If you have any questions or if complications develop after you arrive home, please call [phone].  Discharge instructions have been explained to the patient.  The patient, or the person responsible for the patient, fully understands these instructions.       May resume Tramadol on Feb. 4, 2017, after 11:00 am.

## 2015-05-08 NOTE — Progress Notes (Signed)
Patient states she has been off Tramadol for at least the past two days. 

## 2015-05-12 DIAGNOSIS — M5137 Other intervertebral disc degeneration, lumbosacral region: Secondary | ICD-10-CM | POA: Diagnosis not present

## 2015-05-12 DIAGNOSIS — M5126 Other intervertebral disc displacement, lumbar region: Secondary | ICD-10-CM | POA: Diagnosis not present

## 2015-05-13 ENCOUNTER — Other Ambulatory Visit: Payer: Self-pay | Admitting: Neurosurgery

## 2015-05-14 ENCOUNTER — Telehealth: Payer: Self-pay | Admitting: *Deleted

## 2015-05-14 NOTE — Telephone Encounter (Signed)
Pt is needing clearance for left L2-3 microdiskectomy by dr cram on 06-08-15. Will forward for dr hochrein's review

## 2015-05-14 NOTE — Telephone Encounter (Signed)
The patient recently was having no anginal symptoms.  She has a high function level despite her pain.  There were no high risk findings on exam or history.  The surgery is needed for pain and other neurologic symptoms.  Therefore, based on ACC/AHA guidelines, the patient would be at acceptable risk for the planned procedure without further cardiovascular testing.

## 2015-05-15 NOTE — Telephone Encounter (Signed)
Will send this note to the number provided.

## 2015-05-18 DIAGNOSIS — R8299 Other abnormal findings in urine: Secondary | ICD-10-CM | POA: Diagnosis not present

## 2015-05-18 DIAGNOSIS — I2510 Atherosclerotic heart disease of native coronary artery without angina pectoris: Secondary | ICD-10-CM | POA: Diagnosis not present

## 2015-05-18 DIAGNOSIS — E784 Other hyperlipidemia: Secondary | ICD-10-CM | POA: Diagnosis not present

## 2015-05-18 DIAGNOSIS — E559 Vitamin D deficiency, unspecified: Secondary | ICD-10-CM | POA: Diagnosis not present

## 2015-05-18 DIAGNOSIS — N390 Urinary tract infection, site not specified: Secondary | ICD-10-CM | POA: Diagnosis not present

## 2015-05-18 DIAGNOSIS — R7302 Impaired glucose tolerance (oral): Secondary | ICD-10-CM | POA: Diagnosis not present

## 2015-05-18 DIAGNOSIS — E042 Nontoxic multinodular goiter: Secondary | ICD-10-CM | POA: Diagnosis not present

## 2015-05-25 DIAGNOSIS — N390 Urinary tract infection, site not specified: Secondary | ICD-10-CM | POA: Diagnosis not present

## 2015-05-25 DIAGNOSIS — R7302 Impaired glucose tolerance (oral): Secondary | ICD-10-CM | POA: Diagnosis not present

## 2015-05-25 DIAGNOSIS — M353 Polymyalgia rheumatica: Secondary | ICD-10-CM | POA: Diagnosis not present

## 2015-05-25 DIAGNOSIS — Z9861 Coronary angioplasty status: Secondary | ICD-10-CM | POA: Diagnosis not present

## 2015-05-25 DIAGNOSIS — M810 Age-related osteoporosis without current pathological fracture: Secondary | ICD-10-CM | POA: Diagnosis not present

## 2015-05-25 DIAGNOSIS — Z1389 Encounter for screening for other disorder: Secondary | ICD-10-CM | POA: Diagnosis not present

## 2015-05-25 DIAGNOSIS — Z6824 Body mass index (BMI) 24.0-24.9, adult: Secondary | ICD-10-CM | POA: Diagnosis not present

## 2015-05-25 DIAGNOSIS — E784 Other hyperlipidemia: Secondary | ICD-10-CM | POA: Diagnosis not present

## 2015-05-25 DIAGNOSIS — E042 Nontoxic multinodular goiter: Secondary | ICD-10-CM | POA: Diagnosis not present

## 2015-05-25 DIAGNOSIS — M1990 Unspecified osteoarthritis, unspecified site: Secondary | ICD-10-CM | POA: Diagnosis not present

## 2015-05-25 DIAGNOSIS — I447 Left bundle-branch block, unspecified: Secondary | ICD-10-CM | POA: Diagnosis not present

## 2015-05-25 DIAGNOSIS — Z0000 Encounter for general adult medical examination without abnormal findings: Secondary | ICD-10-CM | POA: Diagnosis not present

## 2015-05-28 ENCOUNTER — Telehealth: Payer: Self-pay

## 2015-05-28 NOTE — Telephone Encounter (Signed)
Request for surgical clearance:   1. What type surgery is being performed? Left L2-3 Microdiskectomy  2. When is this surgery scheduled? 06/08/2015  3. Are there any medications that need to be held prior to surgery and how long? N/A; but pt is on ASA 81  4. Name of the physician performing surgery: Dr Kary Kos  5. What is the office phone and fax number? [facility]  Phone- [phone]  Fax- [phone]

## 2015-05-28 NOTE — Telephone Encounter (Signed)
Please see the phone note from 2/9.

## 2015-06-01 ENCOUNTER — Encounter (HOSPITAL_COMMUNITY): Payer: Self-pay

## 2015-06-01 ENCOUNTER — Encounter (HOSPITAL_COMMUNITY)
Admission: RE | Admit: 2015-06-01 | Discharge: 2015-06-01 | Disposition: A | Discharge status: Home / Self Care | Payer: Medicare Other | Source: Ambulatory Visit | Attending: Neurosurgery | Admitting: Neurosurgery

## 2015-06-01 DIAGNOSIS — Z950 Presence of cardiac pacemaker: Secondary | ICD-10-CM | POA: Insufficient documentation

## 2015-06-01 DIAGNOSIS — Z955 Presence of coronary angioplasty implant and graft: Secondary | ICD-10-CM | POA: Diagnosis not present

## 2015-06-01 DIAGNOSIS — M5126 Other intervertebral disc displacement, lumbar region: Secondary | ICD-10-CM | POA: Insufficient documentation

## 2015-06-01 DIAGNOSIS — Z79899 Other long term (current) drug therapy: Secondary | ICD-10-CM | POA: Diagnosis not present

## 2015-06-01 DIAGNOSIS — Z7982 Long term (current) use of aspirin: Secondary | ICD-10-CM | POA: Diagnosis not present

## 2015-06-01 DIAGNOSIS — I10 Essential (primary) hypertension: Secondary | ICD-10-CM | POA: Diagnosis not present

## 2015-06-01 DIAGNOSIS — Z01818 Encounter for other preprocedural examination: Secondary | ICD-10-CM | POA: Insufficient documentation

## 2015-06-01 DIAGNOSIS — I442 Atrioventricular block, complete: Secondary | ICD-10-CM | POA: Insufficient documentation

## 2015-06-01 DIAGNOSIS — Z01812 Encounter for preprocedural laboratory examination: Secondary | ICD-10-CM | POA: Diagnosis not present

## 2015-06-01 DIAGNOSIS — E785 Hyperlipidemia, unspecified: Secondary | ICD-10-CM | POA: Insufficient documentation

## 2015-06-01 HISTORY — DX: Personal history of other diseases of the digestive system: Z87.19

## 2015-06-01 HISTORY — DX: Unspecified osteoarthritis, unspecified site: M19.90

## 2015-06-01 HISTORY — DX: Unspecified asthma, uncomplicated: J45.909

## 2015-06-01 HISTORY — DX: Adverse effect of unspecified anesthetic, initial encounter: T41.45XA

## 2015-06-01 HISTORY — DX: Family history of other specified conditions: Z84.89

## 2015-06-01 LAB — CBC
HEMATOCRIT: 40.8 % (ref 36.0–46.0)
HEMOGLOBIN: 13.6 g/dL (ref 12.0–15.0)
MCH: 29.1 pg (ref 26.0–34.0)
MCHC: 33.3 g/dL (ref 30.0–36.0)
MCV: 87.2 fL (ref 78.0–100.0)
Platelets: 250 10*3/uL (ref 150–400)
RBC: 4.68 MIL/uL (ref 3.87–5.11)
RDW: 12.9 % (ref 11.5–15.5)
WBC: 6.7 10*3/uL (ref 4.0–10.5)

## 2015-06-01 LAB — SURGICAL PCR SCREEN
MRSA, PCR: NEGATIVE
STAPHYLOCOCCUS AUREUS: NEGATIVE

## 2015-06-01 LAB — BASIC METABOLIC PANEL
ANION GAP: 12 (ref 5–15)
BUN: 18 mg/dL (ref 6–20)
CHLORIDE: 102 mmol/L (ref 101–111)
CO2: 27 mmol/L (ref 22–32)
Calcium: 10 mg/dL (ref 8.9–10.3)
Creatinine, Ser: 0.85 mg/dL (ref 0.44–1.00)
GFR calc Af Amer: >60 mL/min (ref >60)
GLUCOSE: 92 mg/dL (ref 65–99)
POTASSIUM: 4.7 mmol/L (ref 3.5–5.1)
Sodium: 141 mmol/L (ref 135–145)

## 2015-06-01 NOTE — Pre-Procedure Instructions (Signed)
MORRISA SCHAEFER  06/01/2015      [id number] [place], [place] [id number] [place] [id number] Phone: [phone] Fax: [phone]  [facility] - [place], [place] [address] Phone: [phone] Fax: [phone]    Your procedure is scheduled on 06/08/2015.  Report to [facility] Admitting at 5:30 A.M.  Call this number if you have problems the morning of surgery:  [phone]   Remember:  Do not eat food or drink liquids after midnight.   On Sunday   Take these medicines the morning of surgery with A SIP OF WATER :  PAIN MEDICINE can be taken if needed    Do not wear jewelry, make-up or nail polish.   Do not wear lotions, powders, or perfumes.  You may wear deodorant.   Do not shave 48 hours prior to surgery.      Do not bring valuables to the hospital.   [facility] is not responsible for any belongings or valuables.  Contacts, dentures or bridgework may not be worn into surgery.  Leave your suitcase in the car.  After surgery it may be brought to your room.  For patients admitted to the hospital, discharge time will be determined by your treatment team.  Patients discharged the day of surgery will not be allowed to drive home.   Name and phone number of your driver:   With spouse  Special instructions:  Special Instructions: [facility] - Preparing for Surgery  Before surgery, you can play an important role.  Because skin is not sterile, your skin needs to be as free of germs as possible.  You can reduce the number of germs on you skin by washing with CHG (chlorahexidine gluconate) soap before surgery.  CHG is an antiseptic cleaner which kills germs and bonds with the skin to continue killing germs even after washing.  Please DO NOT use if you have an allergy to CHG or  antibacterial soaps.  If your skin becomes reddened/irritated stop using the CHG and inform your nurse when you arrive at Short Stay.  Do not shave (including legs and underarms) for at least 48 hours prior to the first CHG shower.  You may shave your face.  Please follow these instructions carefully:   1.  Shower with CHG Soap the night before surgery and the  morning of Surgery.  2.  If you choose to wash your hair, wash your hair first as usual with your  normal shampoo.  3.  After you shampoo, rinse your hair and body thoroughly to remove the  Shampoo.  4.  Use CHG as you would any other liquid soap.  You can apply chg directly to the skin and wash gently with scrungie or a clean washcloth.  5.  Apply the CHG Soap to your body ONLY FROM THE NECK DOWN.    Do not use on open wounds or open sores.  Avoid contact with your eyes, ears, mouth and genitals (private parts).  Wash genitals (private parts)   with your normal soap.  6.  Wash thoroughly, paying special attention to the area where your surgery will be performed.  7.  Thoroughly rinse your body with warm water from the neck down.  8.  DO NOT shower/wash with your normal soap after using  and rinsing off   the CHG Soap.  9.  Pat yourself dry with a clean towel.            10.  Wear clean pajamas.            11.  Place clean sheets on your bed the night of your first shower and do not sleep with pets.  Day of Surgery  Do not apply any lotions/deodorants the morning of surgery.  Please wear clean clothes to the hospital/surgery center.  Please read over the following fact sheets that you were given. Pain Booklet, Coughing and Deep Breathing, MRSA Information and Surgical Site Infection Prevention

## 2015-06-01 NOTE — Progress Notes (Signed)
Pt. Reports that with pacemaker insertion she was told that the intubation was a bit of a problem , perhaps small airway. She reports other procedures since then but she doesn't recall any problems with anesth. Although the procedures were breast biopsy & temporal artery biopsy. Pt. reports that she had a stent placed 15 + yrs. Ago, here in [place], under the care of Dr. Loni Muse. Little.  Pt. Has pacemaker (St. Jude) & it managed by Dr. Rayann Heman in the device clinic.  Order sent for Dr. Jackalyn Lombard review via fax.  Requested records from. [facility] in [place], [place]  Call to A. Kabbe,NP, and there will be further review of the situation after records were rec'd from [place], [place]

## 2015-06-02 DIAGNOSIS — M19011 Primary osteoarthritis, right shoulder: Secondary | ICD-10-CM | POA: Diagnosis not present

## 2015-06-02 NOTE — Progress Notes (Signed)
Anesthesia Chart Review:  Pt is a 82 year old female scheduled for L2-3 microdiscectomy on 06/08/2015 with Dr. Saintclair Halsted.   Cardiologist is Dr. Minus Breeding, who cleared pt for surgery in Epic telephone encounter dated 05/14/15.   PMH includes:  Complete heart block, pacemaker (St. Jude), CAD (stent to RCA 2002), HTN, hyperlipidemia, asthma (as a child). Never smoker. BMI 25.  Pt reported she was told she was a difficult intubation at the time her pacemaker was implanted.  Records from that procedure from [facility] in [place], [place] indicate oral intubation was easy (on paper chart).   Medications include: ASA, lipitor  Preoperative labs reviewed.    EKG 06/01/15: AV dual-paced rhythm with prolonged AV conduction  Nuclear stress test 03/14/11: Inferior/inferoseptal and apical defect consistent with scar and soft tissue attenuation. No ischemia. LVEF 63% with wall motion as noted.  Perioperative pacemaker prescription form pending.   If no changes, I anticipate pt can proceed with surgery as scheduled.   Willeen Cass, FNP-BC [facility] Short Stay Surgical Center/Anesthesiology Phone: [phone] 06/02/2015 4:03 PM

## 2015-06-07 NOTE — Anesthesia Preprocedure Evaluation (Addendum)
Anesthesia Evaluation  Patient identified by MRN, date of birth, ID band Patient awake    Reviewed: Allergy & Precautions, H&P , Patient's Chart, lab work & pertinent test results, reviewed documented beta blocker date and time   History of Anesthesia Complications (+) DIFFICULT AIRWAY and history of anesthetic complications  Airway Mallampati: II  TM Distance: >3 FB Neck ROM: full    Dental no notable dental hx. (+) Teeth Intact, Dental Advisory Given   Pulmonary    Pulmonary exam normal breath sounds clear to auscultation       Cardiovascular hypertension,  Rhythm:regular Rate:Normal     Neuro/Psych    GI/Hepatic   Endo/Other    Renal/GU      Musculoskeletal   Abdominal   Peds  Hematology   Anesthesia Other Findings   Reproductive/Obstetrics                            Anesthesia Physical Anesthesia Plan  ASA: II  Anesthesia Plan: General   Post-op Pain Management:    Induction: Intravenous  Airway Management Planned: Video Laryngoscope Planned  Additional Equipment:   Intra-op Plan:   Post-operative Plan: Extubation in OR  Informed Consent: I have reviewed the patients History and Physical, chart, labs and discussed the procedure including the risks, benefits and alternatives for the proposed anesthesia with the patient or authorized representative who has indicated his/her understanding and acceptance.   Dental Advisory Given and Dental advisory given  Plan Discussed with: CRNA and Surgeon  Anesthesia Plan Comments: (  Discussed general anesthesia, including possible nausea, instrumentation of airway, sore throat,pulmonary aspiration, etc. I asked if the were any outstanding questions, or  concerns before we proceeded. )        Anesthesia Quick Evaluation

## 2015-06-08 ENCOUNTER — Encounter (HOSPITAL_COMMUNITY): Payer: Self-pay | Admitting: *Deleted

## 2015-06-08 ENCOUNTER — Ambulatory Visit (HOSPITAL_COMMUNITY): Payer: Medicare Other | Admitting: Anesthesiology

## 2015-06-08 ENCOUNTER — Inpatient Hospital Stay (HOSPITAL_COMMUNITY)
Admission: RE | Admit: 2015-06-08 | Discharge: 2015-06-10 | DRG: 519 | Disposition: A | Discharge status: Home / Self Care | Payer: Medicare Other | Source: Ambulatory Visit | Attending: Neurosurgery | Admitting: Neurosurgery

## 2015-06-08 ENCOUNTER — Encounter (HOSPITAL_COMMUNITY)
Admission: RE | Disposition: A | Discharge status: Home / Self Care | Payer: Self-pay | Source: Ambulatory Visit | Attending: Neurosurgery

## 2015-06-08 ENCOUNTER — Ambulatory Visit (HOSPITAL_COMMUNITY): Payer: Medicare Other

## 2015-06-08 ENCOUNTER — Ambulatory Visit (HOSPITAL_COMMUNITY): Payer: Medicare Other | Admitting: Emergency Medicine

## 2015-06-08 DIAGNOSIS — M5126 Other intervertebral disc displacement, lumbar region: Secondary | ICD-10-CM | POA: Diagnosis not present

## 2015-06-08 DIAGNOSIS — I2510 Atherosclerotic heart disease of native coronary artery without angina pectoris: Secondary | ICD-10-CM | POA: Diagnosis present

## 2015-06-08 DIAGNOSIS — Z955 Presence of coronary angioplasty implant and graft: Secondary | ICD-10-CM

## 2015-06-08 DIAGNOSIS — Z950 Presence of cardiac pacemaker: Secondary | ICD-10-CM | POA: Diagnosis not present

## 2015-06-08 DIAGNOSIS — Z888 Allergy status to other drugs, medicaments and biological substances status: Secondary | ICD-10-CM

## 2015-06-08 DIAGNOSIS — Z885 Allergy status to narcotic agent status: Secondary | ICD-10-CM | POA: Diagnosis not present

## 2015-06-08 DIAGNOSIS — Z9889 Other specified postprocedural states: Secondary | ICD-10-CM | POA: Diagnosis not present

## 2015-06-08 DIAGNOSIS — I10 Essential (primary) hypertension: Secondary | ICD-10-CM | POA: Diagnosis present

## 2015-06-08 DIAGNOSIS — Z7982 Long term (current) use of aspirin: Secondary | ICD-10-CM

## 2015-06-08 DIAGNOSIS — M353 Polymyalgia rheumatica: Secondary | ICD-10-CM | POA: Diagnosis present

## 2015-06-08 DIAGNOSIS — G9611 Dural tear: Secondary | ICD-10-CM | POA: Diagnosis not present

## 2015-06-08 DIAGNOSIS — Z419 Encounter for procedure for purposes other than remedying health state, unspecified: Secondary | ICD-10-CM

## 2015-06-08 DIAGNOSIS — E785 Hyperlipidemia, unspecified: Secondary | ICD-10-CM | POA: Diagnosis not present

## 2015-06-08 HISTORY — PX: LUMBAR LAMINECTOMY/DECOMPRESSION MICRODISCECTOMY: SHX5026

## 2015-06-08 SURGERY — LUMBAR LAMINECTOMY/DECOMPRESSION MICRODISCECTOMY 1 LEVEL
Anesthesia: General | Site: Spine Lumbar | Laterality: Left

## 2015-06-08 MED ORDER — MENTHOL 3 MG MT LOZG: 1.0000 | LOZENGE | OROMUCOSAL | Status: DC | PRN

## 2015-06-08 MED ORDER — NAPROXEN 250 MG PO TABS
375.0000 mg | ORAL_TABLET | Freq: Two times a day (BID) | ORAL | Status: DC | PRN
  Filled 2015-06-08: qty 2

## 2015-06-08 MED ORDER — ACETAMINOPHEN 160 MG/5ML PO SOLN
650.0000 mg | Freq: Once | ORAL | Status: AC
  Administered 2015-06-08: 650 mg via ORAL
  Filled 2015-06-08: qty 20.3

## 2015-06-08 MED ORDER — PHENOL 1.4 % MT LIQD: 1.0000 | OROMUCOSAL | Status: DC | PRN

## 2015-06-08 MED ORDER — PROPOFOL 10 MG/ML IV BOLUS
INTRAVENOUS | Status: AC
  Filled 2015-06-08: qty 20

## 2015-06-08 MED ORDER — LIDOCAINE HCL (CARDIAC) 20 MG/ML IV SOLN
INTRAVENOUS | Status: AC
  Filled 2015-06-08: qty 5

## 2015-06-08 MED ORDER — PHENYLEPHRINE HCL 10 MG/ML IJ SOLN
INTRAMUSCULAR | Status: DC | PRN
  Administered 2015-06-08: 40 ug via INTRAVENOUS
  Administered 2015-06-08 (×3): 80 ug via INTRAVENOUS
  Administered 2015-06-08 (×3): 40 ug via INTRAVENOUS

## 2015-06-08 MED ORDER — CEFAZOLIN SODIUM-DEXTROSE 2-3 GM-% IV SOLR
2.0000 g | INTRAVENOUS | Status: AC
  Administered 2015-06-08: 2 g via INTRAVENOUS
  Filled 2015-06-08: qty 50

## 2015-06-08 MED ORDER — THROMBIN 5000 UNITS EX SOLR
CUTANEOUS | Status: DC | PRN
  Administered 2015-06-08 (×2): 5000 [IU] via TOPICAL

## 2015-06-08 MED ORDER — SUGAMMADEX SODIUM 200 MG/2ML IV SOLN
INTRAVENOUS | Status: AC
  Filled 2015-06-08: qty 2

## 2015-06-08 MED ORDER — ROCURONIUM BROMIDE 50 MG/5ML IV SOLN
INTRAVENOUS | Status: AC
  Filled 2015-06-08: qty 1

## 2015-06-08 MED ORDER — FENTANYL CITRATE (PF) 250 MCG/5ML IJ SOLN
INTRAMUSCULAR | Status: AC
  Filled 2015-06-08: qty 5

## 2015-06-08 MED ORDER — NAPROXEN SODIUM 275 MG PO TABS: 440.0000 mg | ORAL_TABLET | Freq: Two times a day (BID) | ORAL | Status: DC | PRN

## 2015-06-08 MED ORDER — SODIUM CHLORIDE 0.9% FLUSH: 3.0000 mL | INTRAVENOUS | Status: DC | PRN

## 2015-06-08 MED ORDER — SUCCINYLCHOLINE CHLORIDE 20 MG/ML IJ SOLN
INTRAMUSCULAR | Status: AC
  Filled 2015-06-08: qty 1

## 2015-06-08 MED ORDER — ACETAMINOPHEN 325 MG PO TABS: 650.0000 mg | ORAL_TABLET | ORAL | Status: DC | PRN

## 2015-06-08 MED ORDER — HYDROMORPHONE HCL 1 MG/ML IJ SOLN: 0.5000 mg | INTRAMUSCULAR | Status: DC | PRN

## 2015-06-08 MED ORDER — EPHEDRINE SULFATE 50 MG/ML IJ SOLN
INTRAMUSCULAR | Status: AC
  Filled 2015-06-08: qty 1

## 2015-06-08 MED ORDER — ONDANSETRON HCL 4 MG/2ML IJ SOLN
INTRAMUSCULAR | Status: AC
  Filled 2015-06-08: qty 2

## 2015-06-08 MED ORDER — ACETAMINOPHEN 650 MG RE SUPP: 650.0000 mg | RECTAL | Status: DC | PRN

## 2015-06-08 MED ORDER — VITAMIN D (ERGOCALCIFEROL) 1.25 MG (50000 UNIT) PO CAPS
50000.0000 [IU] | ORAL_CAPSULE | ORAL | Status: DC
  Administered 2015-06-10: 50000 [IU] via ORAL
  Filled 2015-06-08 (×2): qty 1

## 2015-06-08 MED ORDER — ROCURONIUM BROMIDE 100 MG/10ML IV SOLN
INTRAVENOUS | Status: DC | PRN
  Administered 2015-06-08: 30 mg via INTRAVENOUS

## 2015-06-08 MED ORDER — ONDANSETRON HCL 4 MG/2ML IJ SOLN
4.0000 mg | INTRAMUSCULAR | Status: DC | PRN
  Filled 2015-06-08: qty 2

## 2015-06-08 MED ORDER — CEFAZOLIN SODIUM 1-5 GM-% IV SOLN
1.0000 g | Freq: Three times a day (TID) | INTRAVENOUS | Status: AC
  Administered 2015-06-08 (×2): 1 g via INTRAVENOUS
  Filled 2015-06-08 (×3): qty 50

## 2015-06-08 MED ORDER — SODIUM CHLORIDE 0.9 % IJ SOLN
INTRAMUSCULAR | Status: AC
  Filled 2015-06-08: qty 10

## 2015-06-08 MED ORDER — ONDANSETRON HCL 4 MG/2ML IJ SOLN
INTRAMUSCULAR | Status: DC | PRN
  Administered 2015-06-08: 4 mg via INTRAVENOUS

## 2015-06-08 MED ORDER — OCUVITE-LUTEIN PO CAPS
1.0000 | ORAL_CAPSULE | Freq: Every day | ORAL | Status: DC
  Administered 2015-06-08: 1 via ORAL
  Filled 2015-06-08 (×2): qty 1

## 2015-06-08 MED ORDER — BACITRACIN 50000 UNITS IM SOLR
INTRAMUSCULAR | Status: DC | PRN
  Administered 2015-06-08: 09:00:00

## 2015-06-08 MED ORDER — ASPIRIN EC 81 MG PO TBEC
81.0000 mg | DELAYED_RELEASE_TABLET | Freq: Every day | ORAL | Status: DC
  Administered 2015-06-08 – 2015-06-10 (×3): 81 mg via ORAL
  Filled 2015-06-08 (×3): qty 1

## 2015-06-08 MED ORDER — SUGAMMADEX SODIUM 200 MG/2ML IV SOLN
INTRAVENOUS | Status: DC | PRN
  Administered 2015-06-08: 110 mg via INTRAVENOUS

## 2015-06-08 MED ORDER — TRAMADOL HCL 50 MG PO TABS: 50.0000 mg | ORAL_TABLET | Freq: Two times a day (BID) | ORAL | Status: DC | PRN

## 2015-06-08 MED ORDER — ATORVASTATIN CALCIUM 10 MG PO TABS
20.0000 mg | ORAL_TABLET | Freq: Every day | ORAL | Status: DC
  Administered 2015-06-08 – 2015-06-10 (×3): 20 mg via ORAL
  Filled 2015-06-08 (×3): qty 2

## 2015-06-08 MED ORDER — LIDOCAINE-EPINEPHRINE 1 %-1:100000 IJ SOLN
INTRAMUSCULAR | Status: DC | PRN
  Administered 2015-06-08: 8 mL

## 2015-06-08 MED ORDER — FENTANYL CITRATE (PF) 100 MCG/2ML IJ SOLN: 25.0000 ug | INTRAMUSCULAR | Status: DC | PRN

## 2015-06-08 MED ORDER — SODIUM CHLORIDE 0.9 % IV SOLN: 250.0000 mL | INTRAVENOUS | Status: DC

## 2015-06-08 MED ORDER — SODIUM CHLORIDE 0.9% FLUSH
3.0000 mL | Freq: Two times a day (BID) | INTRAVENOUS | Status: DC
  Administered 2015-06-08 – 2015-06-10 (×3): 3 mL via INTRAVENOUS

## 2015-06-08 MED ORDER — ARTIFICIAL TEARS OP OINT
TOPICAL_OINTMENT | OPHTHALMIC | Status: AC
  Filled 2015-06-08: qty 3.5

## 2015-06-08 MED ORDER — OXYCODONE-ACETAMINOPHEN 5-325 MG PO TABS
1.0000 | ORAL_TABLET | ORAL | Status: DC | PRN
  Administered 2015-06-08 – 2015-06-10 (×5): 1 via ORAL
  Filled 2015-06-08 (×2): qty 1
  Filled 2015-06-08: qty 2
  Filled 2015-06-08 (×2): qty 1

## 2015-06-08 MED ORDER — LACTATED RINGERS IV SOLN
INTRAVENOUS | Status: DC | PRN
  Administered 2015-06-08: 07:00:00 via INTRAVENOUS

## 2015-06-08 MED ORDER — HEMOSTATIC AGENTS (NO CHARGE) OPTIME
TOPICAL | Status: DC | PRN
  Administered 2015-06-08: 1 via TOPICAL

## 2015-06-08 MED ORDER — PRESERVISION AREDS PO CAPS: ORAL_CAPSULE | Freq: Every day | ORAL | Status: DC

## 2015-06-08 MED ORDER — 0.9 % SODIUM CHLORIDE (POUR BTL) OPTIME
TOPICAL | Status: DC | PRN
  Administered 2015-06-08: 1000 mL

## 2015-06-08 MED ORDER — PROPOFOL 10 MG/ML IV BOLUS
INTRAVENOUS | Status: DC | PRN
  Administered 2015-06-08: 90 mg via INTRAVENOUS

## 2015-06-08 MED ORDER — ALUM & MAG HYDROXIDE-SIMETH 200-200-20 MG/5ML PO SUSP
30.0000 mL | ORAL | Status: DC | PRN
  Administered 2015-06-08: 30 mL via ORAL
  Filled 2015-06-08 (×2): qty 30

## 2015-06-08 MED ORDER — FENTANYL CITRATE (PF) 250 MCG/5ML IJ SOLN
INTRAMUSCULAR | Status: DC | PRN
  Administered 2015-06-08: 50 ug via INTRAVENOUS

## 2015-06-08 MED ORDER — LIDOCAINE HCL (CARDIAC) 20 MG/ML IV SOLN
INTRAVENOUS | Status: DC | PRN
  Administered 2015-06-08: 80 mg via INTRAVENOUS

## 2015-06-08 SURGICAL SUPPLY — 57 items
APL SKNCLS STERI-STRIP NONHPOA (GAUZE/BANDAGES/DRESSINGS) ×1
APL SRG 60D 8 XTD TIP BNDBL (TIP) ×1
BAG DECANTER FOR FLEXI CONT (MISCELLANEOUS) ×3 IMPLANT
BENZOIN TINCTURE PRP APPL 2/3 (GAUZE/BANDAGES/DRESSINGS) ×3 IMPLANT
BLADE CLIPPER SURG (BLADE) IMPLANT
BLADE SURG 11 STRL SS (BLADE) ×5 IMPLANT
BRUSH SCRUB EZ PLAIN DRY (MISCELLANEOUS) ×3 IMPLANT
BUR MATCHSTICK NEURO 3.0 LAGG (BURR) ×3 IMPLANT
BUR PRECISION FLUTE 6.0 (BURR) ×3 IMPLANT
CANISTER SUCT 3000ML PPV (MISCELLANEOUS) ×3 IMPLANT
CLOSURE WOUND 1/2 X4 (GAUZE/BANDAGES/DRESSINGS) ×1
DECANTER SPIKE VIAL GLASS SM (MISCELLANEOUS) ×1 IMPLANT
DRAPE LAPAROTOMY 100X72X124 (DRAPES) ×3 IMPLANT
DRAPE MICROSCOPE LEICA (MISCELLANEOUS) ×3 IMPLANT
DRAPE POUCH INSTRU U-SHP 10X18 (DRAPES) ×3 IMPLANT
DRAPE PROXIMA HALF (DRAPES) ×2 IMPLANT
DRAPE SURG 17X23 STRL (DRAPES) ×3 IMPLANT
DRSG OPSITE POSTOP 4X6 (GAUZE/BANDAGES/DRESSINGS) ×2 IMPLANT
DURAPREP 26ML APPLICATOR (WOUND CARE) ×3 IMPLANT
DURASEAL APPLICATOR TIP (TIP) ×2 IMPLANT
DURASEAL SPINE SEALANT 3ML (MISCELLANEOUS) ×2 IMPLANT
ELECT REM PT RETURN 9FT ADLT (ELECTROSURGICAL) ×3
ELECTRODE REM PT RTRN 9FT ADLT (ELECTROSURGICAL) ×1 IMPLANT
GAUZE SPONGE 4X4 12PLY STRL (GAUZE/BANDAGES/DRESSINGS) ×1 IMPLANT
GAUZE SPONGE 4X4 16PLY XRAY LF (GAUZE/BANDAGES/DRESSINGS) IMPLANT
GLOVE BIO SURGEON STRL SZ8 (GLOVE) ×3 IMPLANT
GLOVE BIOGEL PI IND STRL 7.5 (GLOVE) IMPLANT
GLOVE BIOGEL PI IND STRL 8 (GLOVE) IMPLANT
GLOVE BIOGEL PI INDICATOR 7.5 (GLOVE) ×4
GLOVE BIOGEL PI INDICATOR 8 (GLOVE) ×4
GLOVE ECLIPSE 8.0 STRL XLNG CF (GLOVE) ×6 IMPLANT
GLOVE INDICATOR 8.5 STRL (GLOVE) ×3 IMPLANT
GLOVE SURG SS PI 7.0 STRL IVOR (GLOVE) ×2 IMPLANT
GOWN STRL REUS W/ TWL LRG LVL3 (GOWN DISPOSABLE) ×1 IMPLANT
GOWN STRL REUS W/ TWL XL LVL3 (GOWN DISPOSABLE) ×2 IMPLANT
GOWN STRL REUS W/TWL 2XL LVL3 (GOWN DISPOSABLE) ×4 IMPLANT
GOWN STRL REUS W/TWL LRG LVL3 (GOWN DISPOSABLE) ×3
GOWN STRL REUS W/TWL XL LVL3 (GOWN DISPOSABLE) ×3
GRAFT DURAGEN MATRIX 1WX1L (Tissue) ×2 IMPLANT
KIT BASIN OR (CUSTOM PROCEDURE TRAY) ×3 IMPLANT
KIT ROOM TURNOVER OR (KITS) ×3 IMPLANT
LIQUID BAND (GAUZE/BANDAGES/DRESSINGS) ×3 IMPLANT
NDL SPNL 22GX3.5 QUINCKE BK (NEEDLE) ×1 IMPLANT
NEEDLE HYPO 22GX1.5 SAFETY (NEEDLE) ×3 IMPLANT
NEEDLE SPNL 22GX3.5 QUINCKE BK (NEEDLE) ×3 IMPLANT
NS IRRIG 1000ML POUR BTL (IV SOLUTION) ×3 IMPLANT
PACK LAMINECTOMY NEURO (CUSTOM PROCEDURE TRAY) ×3 IMPLANT
RUBBERBAND STERILE (MISCELLANEOUS) ×6 IMPLANT
SPONGE SURGIFOAM ABS GEL SZ50 (HEMOSTASIS) ×3 IMPLANT
STRIP CLOSURE SKIN 1/2X4 (GAUZE/BANDAGES/DRESSINGS) ×2 IMPLANT
SUT VIC AB 0 CT1 18XCR BRD8 (SUTURE) ×1 IMPLANT
SUT VIC AB 0 CT1 8-18 (SUTURE) ×3
SUT VIC AB 2-0 CT1 18 (SUTURE) ×3 IMPLANT
SUT VICRYL 4-0 PS2 18IN ABS (SUTURE) ×3 IMPLANT
TOWEL OR 17X24 6PK STRL BLUE (TOWEL DISPOSABLE) ×3 IMPLANT
TOWEL OR 17X26 10 PK STRL BLUE (TOWEL DISPOSABLE) ×3 IMPLANT
WATER STERILE IRR 1000ML POUR (IV SOLUTION) ×3 IMPLANT

## 2015-06-08 NOTE — Anesthesia Procedure Notes (Signed)
Procedure Name: Intubation Date/Time: 06/08/2015 7:52 AM Performed by: Barrington Ellison Pre-anesthesia Checklist: Patient identified, Emergency Drugs available, Suction available, Patient being monitored and Timeout performed Patient Re-evaluated:Patient Re-evaluated prior to inductionOxygen Delivery Method: Circle system utilized Preoxygenation: Pre-oxygenation with 100% oxygen Intubation Type: IV induction Ventilation: Mask ventilation without difficulty Laryngoscope Size: Mac and 3 Grade View: Grade I Tube type: Oral Tube size: 7.0 mm Number of attempts: 1 Airway Equipment and Method: Stylet Placement Confirmation: ETT inserted through vocal cords under direct vision,  positive ETCO2 and breath sounds checked- equal and bilateral Secured at: 21 cm Tube secured with: Tape Dental Injury: Teeth and Oropharynx as per pre-operative assessment

## 2015-06-08 NOTE — Progress Notes (Signed)
Jamie Reese called and informed of surgery today at 0730, states he will come by and look at it.

## 2015-06-08 NOTE — Op Note (Signed)
Preoperative diagnosis: Herniated nucleus pulposis L2-3 left  Postoperative diagnosis: Same  Procedure: Lumbar laminectomy microdiscectomy L2-3 on the left with microdissection of the left L3 nerve root microscopic discectomy  Surgeon: Dr. Phillip Heal  Anesthesia: Gen.  EBL: Minimal  History of present illness: This is very pleasant 82 year old female has a progressive worsening back and left leg pain radiating down to the anteromedial 5 workup revealed a large disc herniation at L2-3 on the left and due to the patient's failure of conservative treatment progression of clinical syndrome  imaging findings and progressive clinical syndrome I recommended lumbar laminectomy microscopic discectomy at L2-3 on the left eye extensively went over the risks and benefits of the operation with the patient as well as perioperative course expectations of outcome and alternatives to surgery and she understood and agreed to proceed forward.  Operative procedure: Patient was brought into the OR was induced under general anesthesia positioned prone on the Wilson frame her back was prepped and draped in routine sterile fashion preoperative x-ray localized the appropriate level so after infiltration of 6 mL lidocaine with epi a midline incision was made and Bovie K cautery was used to take down the subcutaneous tissue and subperiosteal dissection the lamina of L2-3 on the left. Intraoperative x-ray confirmed good condition "level so the inferoposterior aspect of lamina of L2 was drilled out with a high-speed drill medial facet complex super aspect of the lamina of L3. Laminotomy was begun a little ligamentum flavum was identified and removed in piecemeal fashion exposing the thecal sac. Under microscopic illumination, a further under bit the medial facet complex identifying the lateral aspect of the canal and the left L3 pedicle. As I was dissecting the nerve root off of the pedicle and off of the disc space there was an  inadvertent dural rent with small out pocketing of arachnoid that was not actively leaking CSF. I packed this away and further removed a large amount of free fragment disc and multiple additional fragments were removed the disc spaces and further incised and cleaned out pituitary rongeurs. I did a discectomy the L3 nerve root was widely decompressed and no further fragments were appreciated. I took a small piece of DuraGen and laid it over the dural defect as this was in the undersurface of the L3 nerve root and thecal sac adjacent to this space I did not feel like adequately primarily repaired. I escorted the DuraSeal over the DuraGen later more Gelfoam and some additional DuraSeal and then closed the wound tightly with interrupted Vicryls and running 4 subcuticular Dermabond benzoin and Steri-Strips. Wound was dressed patient covering stable condition. At the R needle counts sponge counts were correct.

## 2015-06-08 NOTE — H&P (Signed)
Jamie Reese is an 82 y.o. female.   Chief Complaint: Back and left leg pain for years ago Saturday we flying around HPI: Patient is very pleasant 82 year old female is a progress worsening back and left leg pain radiating into her anterior medial quad. Workup has revealed large disc herniation at L2-3 on the left. Due to her failure of conservative treatment imaging findings and progression of clinical syndrome I recommended laminectomy microdiscectomy at L2-3 on the left I've extensively gone over the risks and benefits of the operation the patient as well as perioperative course expectations of outcome and alternatives of surgery and she understands and agrees to proceed forward.  Past Medical History  Diagnosis Date  . Complete heart block (HCC)     s/p PPM  . Coronary artery disease     s/p PCI  . Hyperlipidemia   . Irritable bowel syndrome   . Polymyalgia rheumatica ([place])   . H/O: hysterectomy   . S/P breast biopsy     secondary to nipple discharge  . Complication of anesthesia 2011    told that it as hard to pass tube when they intubated for pacemaker insertion   . Family history of adverse reaction to anesthesia     father hard to put to sleep but he also used a lot alcohol    . Hypertension     not treated with med.   . Presence of permanent cardiac pacemaker   . Giant cell arteritis (HCC)     polymyalgia rheumatica  . Asthma     as a child  . History of hiatal hernia   . Arthritis     R shoulder, both knees & low back- degenerative     Past Surgical History  Procedure Laterality Date  . Appendectomy    . Tonsillectomy    . Cholecystectomy    . Breast biopsy      secondary to nipple discharge  . Pilondial cyst resection    . Cesarean section    . Temporal artery biopsy / ligation    . Pacemaker insertion  11/15/2007    St. Jude Pacemaker Zephyr XL implanted in [place]  . Abdominal hysterectomy    . Eye surgery      cataracts removed & IOL- both eyes   . Knee  arthroscopy Left   . Insert / replace / remove pacemaker    . Cardiac catheterization  2001    x1stent - [facility]     Family History  Problem Relation Age of Onset  . Cancer     Social History:  reports that she has never smoked. She has never used smokeless tobacco. She reports that she does not drink alcohol or use illicit drugs.  Allergies:  Allergies  Allergen Reactions  . Codeine Nausea And Vomiting and Other (See Comments)    Also passed out  . Cymbalta [Duloxetine Hcl] Nausea And Vomiting and Other (See Comments)    Also passed out  . Hydrocodone     SOB, passed out feeling, nausea   . Lyrica [Pregabalin] Nausea And Vomiting    Medications Prior to Admission  Medication Sig Dispense Refill  . aspirin 81 MG tablet Take 81 mg by mouth daily.      Marland Kitchen atorvastatin (LIPITOR) 20 MG tablet Take 20 mg by mouth daily.      . ergocalciferol (VITAMIN D2) 50000 UNITS capsule Take 50,000 Units by mouth every Wednesday.     . traMADol (ULTRAM) 50 MG tablet  Take 50 mg by mouth 2 (two) times daily as needed (pain).     . Multiple Vitamins-Minerals (PRESERVISION AREDS PO) Take 1 tablet by mouth daily.    . naproxen sodium (ANAPROX) 220 MG tablet Take 440 mg by mouth 2 (two) times daily as needed (for allergy symptoms).      No results found for this or any previous visit (from the past 48 hour(s)). No results found.  Review of Systems  Constitutional: Negative.   HENT: Negative.   Eyes: Negative.   Respiratory: Negative.   Cardiovascular: Negative.   Gastrointestinal: Negative.   Genitourinary: Negative.   Musculoskeletal: Positive for myalgias and back pain.  Neurological: Positive for tingling and sensory change.  Psychiatric/Behavioral: Negative.     Blood pressure 153/49, pulse 70, temperature 98 F (36.7 C), temperature source Oral, height 5' (1.524 m), weight 57.153 kg (126 lb), SpO2 100 %. Physical Exam  Constitutional: She is oriented to person, place, and time.  She appears well-developed and well-nourished.  HENT:  Head: Normocephalic.  Neck: Normal range of motion.  Respiratory: Effort normal.  GI: Soft.  Neurological: She is alert and oriented to person, place, and time. She has normal strength.  Strength is 5 out of 5 in her iliopsoas, quads, hamstrings, gastric, and tibialis, and EHL.  Skin: Skin is warm and dry.     Assessment/Plan 82 year old female presents for a left-sided L2-3 laminectomy microdiscectomy  Aleatha Taite P, MD 06/08/2015, 7:14 AM

## 2015-06-08 NOTE — Anesthesia Postprocedure Evaluation (Signed)
Anesthesia Post Note  Patient: Jamie Reese  Procedure(s) Performed: Procedure(s) (LRB): Left Lumbar Two-Three Microdiskectomy (Left)  Patient location during evaluation: PACU Anesthesia Type: General Level of consciousness: sedated Pain management: satisfactory to patient Vital Signs Assessment: post-procedure vital signs reviewed and stable Respiratory status: spontaneous breathing Cardiovascular status: stable Anesthetic complications: no    Last Vitals:  Filed Vitals:   06/08/15 0938 06/08/15 1015  BP: 145/51 145/57  Pulse: 71 70  Temp:    Resp: 34 69    Last Pain:  Filed Vitals:   06/08/15 1018  PainSc: 0-No pain                 Britton Perkinson EDWARD

## 2015-06-08 NOTE — Transfer of Care (Signed)
Immediate Anesthesia Transfer of Care Note  Patient: Jamie Reese  Procedure(s) Performed: Procedure(s) with comments: Left Lumbar Two-Three Microdiskectomy (Left) - Left L2-3 Microdiskectomy  Patient Location: PACU  Anesthesia Type:General  Level of Consciousness: lethargic and responds to stimulation  Airway & Oxygen Therapy: Patient Spontanous Breathing and Patient connected to nasal cannula oxygen  Post-op Assessment: Report given to RN  Post vital signs: Reviewed and stable  Last Vitals:  Filed Vitals:   06/08/15 0635  BP: 153/49  Pulse: 70  Temp: 123XX123 C    Complications: No apparent anesthesia complications

## 2015-06-09 ENCOUNTER — Encounter (HOSPITAL_COMMUNITY): Payer: Self-pay | Admitting: Neurosurgery

## 2015-06-09 MED ORDER — PROSIGHT PO TABS
1.0000 | ORAL_TABLET | Freq: Every day | ORAL | Status: DC
  Administered 2015-06-09 – 2015-06-10 (×2): 1 via ORAL
  Filled 2015-06-09 (×3): qty 1

## 2015-06-09 NOTE — Progress Notes (Signed)
Subjective: Patient reports Doing well no leg pain no headache condition of back pain  Objective: Vital signs in last 24 hours: Temp:  [97.4 F (36.3 C)-98.4 F (36.9 C)] 97.7 F (36.5 C) (03/07 0545) Pulse Rate:  [70-79] 79 (03/07 0545) Resp:  [7-34] 16 (03/07 0545) BP: (94-152)/(36-65) 119/45 mmHg (03/07 0545) SpO2:  [96 %-100 %] 96 % (03/07 0545)  Intake/Output from previous day: 03/06 0701 - 03/07 0700 In: 700 [I.V.:700] Out: 50 [Blood:50] Intake/Output this shift:    Strength 5 out of 5 wound clean dry and intact  Lab Results: No results for input(s): WBC, HGB, HCT, PLT in the last 72 hours. BMET No results for input(s): NA, K, CL, CO2, GLUCOSE, BUN, CREATININE, CALCIUM in the last 72 hours.  Studies/Results: Dg Lumbar Spine 2-3 Views  06/08/2015  CLINICAL DATA:  Lumbar surgery. EXAM: LUMBAR SPINE - 2-3 VIEW COMPARISON:  None. FINDINGS: Lumbar vertebra numbered with the lowest segmented appearing vertebral body on lateral view as L5. Metallic marker noted posteriorly at the level of L3. IMPRESSION: Metallic marker noted posteriorly at the level of L3. Electronically Signed   By: Marcello Moores  Register   On: 06/08/2015 09:19    Assessment/Plan: Progressive mobilization today raise head of bed this morning out of bed by lunchtim  LOS: 1 day     Julian Askin P 06/09/2015, 7:27 AM

## 2015-06-09 NOTE — Care Management Note (Signed)
Case Management Note  Patient Details  Name: Jamie Reese MRN: [id number] Date of Birth: 07-30-1933  Subjective/Objective:                    Action/Plan: Patient was admitted for a Left Lumbar Two-Three Microdiskectomy.  Lives at home with spouse. Will follow for discharge needs pending PT/OT evals and physician orders.  Expected Discharge Date:                  Expected Discharge Plan:     In-House Referral:     Discharge planning Services     Post Acute Care Choice:    Choice offered to:     DME Arranged:    DME Agency:     HH Arranged:    HH Agency:     Status of Service:  In process, will continue to follow  Medicare Important Message Given:    Date Medicare IM Given:    Medicare IM give by:    Date Additional Medicare IM Given:    Additional Medicare Important Message give by:     If discussed at [place] of Stay Meetings, dates discussed:    Additional Comments:  Rolm Baptise, RN 06/09/2015, 9:17 AM [phone]

## 2015-06-09 NOTE — Progress Notes (Signed)
Pt ambulated with this nurse standby assist with the walker in the hallway. No noted distress. Will continue to monitor.

## 2015-06-10 MED ORDER — OXYCODONE-ACETAMINOPHEN 5-325 MG PO TABS: 1.0000 | ORAL_TABLET | ORAL | Status: DC | PRN

## 2015-06-10 NOTE — Care Management Note (Signed)
Case Management Note  Patient Details  Name: RONALEE HADDOCK MRN: [id number] Date of Birth: 15-Dec-1933  Subjective/Objective:                    Action/Plan: Patient discharging home with self care. No further needs per CM.   Expected Discharge Date:                  Expected Discharge Plan:     In-House Referral:     Discharge planning Services     Post Acute Care Choice:    Choice offered to:     DME Arranged:    DME Agency:     HH Arranged:    HH Agency:     Status of Service:  In process, will continue to follow  Medicare Important Message Given:    Date Medicare IM Given:    Medicare IM give by:    Date Additional Medicare IM Given:    Additional Medicare Important Message give by:     If discussed at [place] of Stay Meetings, dates discussed:    Additional Comments:  Pollie Friar, RN 06/10/2015, 10:54 AM

## 2015-06-10 NOTE — Discharge Summary (Signed)
  Physician Discharge Summary  Patient ID: Jamie Reese MRN: [id number] DOB/AGE: 11-29-1933 82 y.o.  Admit date: 06/08/2015 Discharge date: 06/10/2015  Admission Diagnoses:Herniated nuclear pulposus L2-3  Discharge Diagnoses: Same Active Problems:   HNP (herniated nucleus pulposus), lumbar   Discharged Condition: good  Hospital Course: 82 year old female was made to the hospital underwent laminectomy microdiscectomy at L2-3 on the left. Intraoperatively had an inadvertent dural tear patient was maintained flat bedrest for 24 hours and mobilized on the first postoperative day did very well was angling and voiding tolerating regular diet was having no leg pain and no headache and no evidence of CSF leak patient is stable for discharge home.  Consults: Significant Diagnostic Studies: Treatments: L2-3 laminectomy microdiscectomy left Discharge Exam: Blood pressure 122/46, pulse 69, temperature 98.4 F (36.9 C), temperature source Oral, resp. rate 18, height 5' (1.524 m), weight 57.153 kg (126 lb), SpO2 95 %. Strength out of 5 wound clean dry and intact  Disposition: Home     Medication List    TAKE these medications        aspirin 81 MG tablet  Take 81 mg by mouth daily.     atorvastatin 20 MG tablet  Commonly known as:  LIPITOR  Take 20 mg by mouth daily.     ergocalciferol 50000 units capsule  Commonly known as:  VITAMIN D2  Take 50,000 Units by mouth every Wednesday.     naproxen sodium 220 MG tablet  Commonly known as:  ANAPROX  Take 440 mg by mouth 2 (two) times daily as needed (for allergy symptoms).     oxyCODONE-acetaminophen 5-325 MG tablet  Commonly known as:  PERCOCET/ROXICET  Take 1-2 tablets by mouth every 4 (four) hours as needed for moderate pain.     PRESERVISION AREDS PO  Take 1 tablet by mouth daily.     traMADol 50 MG tablet  Commonly known as:  ULTRAM  Take 50 mg by mouth 2 (two) times daily as needed (pain).           Follow-up  Information    Follow up with [facility] P, MD.   Specialty:  Neurosurgery   Contact information:   [address] [place] [address] [phone]       Signed: Elaina Hoops 06/10/2015, 9:35 AM

## 2015-06-10 NOTE — Progress Notes (Signed)
Patient ID: Jamie Reese, female   DOB: Feb 09, 1934, 82 y.o.   MRN: [id number] Doing well no leg pain no headache back dressing clean dry and intact  Strength 5 out of 5  Discharge home

## 2015-06-10 NOTE — Discharge Instructions (Signed)
No lifting no bending no twisting no driving a riding a car unless she is coming back and forth to see me. May remove the outer dressing in 3-4 days leave the Steri-Strips on and intact. Cover the Steri-Strips with saran wrap for showers only.

## 2015-06-10 NOTE — Progress Notes (Signed)
Pt for discharge home today. Discharge orders received. IV dcd. Discharge instructions and 1 prescription given with verbalized understanding. Spouse at bedside to assist with discharge. Staff brought patient to lobby via wheelchair at 1520. Transported to home by family member.

## 2015-06-26 DIAGNOSIS — H5213 Myopia, bilateral: Secondary | ICD-10-CM | POA: Diagnosis not present

## 2015-06-26 DIAGNOSIS — H353132 Nonexudative age-related macular degeneration, bilateral, intermediate dry stage: Secondary | ICD-10-CM | POA: Diagnosis not present

## 2015-07-02 ENCOUNTER — Encounter: Payer: Medicare Other | Admitting: Nurse Practitioner

## 2015-07-22 DIAGNOSIS — M5126 Other intervertebral disc displacement, lumbar region: Secondary | ICD-10-CM | POA: Diagnosis not present

## 2015-07-24 DIAGNOSIS — M5126 Other intervertebral disc displacement, lumbar region: Secondary | ICD-10-CM | POA: Diagnosis not present

## 2015-07-27 DIAGNOSIS — M5126 Other intervertebral disc displacement, lumbar region: Secondary | ICD-10-CM | POA: Diagnosis not present

## 2015-07-29 DIAGNOSIS — M5126 Other intervertebral disc displacement, lumbar region: Secondary | ICD-10-CM | POA: Diagnosis not present

## 2015-08-03 DIAGNOSIS — M5126 Other intervertebral disc displacement, lumbar region: Secondary | ICD-10-CM | POA: Diagnosis not present

## 2015-08-04 ENCOUNTER — Encounter: Payer: Self-pay | Admitting: *Deleted

## 2015-08-05 DIAGNOSIS — M5126 Other intervertebral disc displacement, lumbar region: Secondary | ICD-10-CM | POA: Diagnosis not present

## 2015-08-07 ENCOUNTER — Encounter: Payer: Self-pay | Admitting: Internal Medicine

## 2015-08-07 ENCOUNTER — Encounter: Payer: Self-pay | Admitting: Nurse Practitioner

## 2015-08-07 ENCOUNTER — Ambulatory Visit (INDEPENDENT_AMBULATORY_CARE_PROVIDER_SITE_OTHER): Payer: Medicare Other | Admitting: Nurse Practitioner

## 2015-08-07 VITALS — BP 124/62 | HR 70 | Ht 60.0 in | Wt 129.2 lb

## 2015-08-07 DIAGNOSIS — I442 Atrioventricular block, complete: Secondary | ICD-10-CM | POA: Diagnosis not present

## 2015-08-07 DIAGNOSIS — I10 Essential (primary) hypertension: Secondary | ICD-10-CM

## 2015-08-07 NOTE — Patient Instructions (Signed)
Medication Instructions:   Your physician recommends that you continue on your current medications as directed. Please refer to the Current Medication list given to you today.    If you need a refill on your cardiac medications before your next appointment, please call your pharmacy.  Labwork:  NONE ORDER TODAY    Testing/Procedures:  NONE ORDER TODAY    Follow-Up: Your physician wants you to follow-up in:  IN  6 MONTHS WITH DEVICE CLINIC  You will receive a reminder letter in the mail two months in advance. If you don't receive a letter, please call our office to schedule the follow-up appointment.   Your physician wants you to follow-up in: ONE YEAR WITH  [facility]  You will receive a reminder letter in the mail two months in advance. If you don't receive a letter, please call our office to schedule the follow-up appointment.     Any Other Special Instructions Will Be Listed Below (If Applicable).                                                                                                                                                   

## 2015-08-07 NOTE — Progress Notes (Signed)
Electrophysiology Office Note Date: 08/07/2015  ID:  Jamie Reese, DOB 19-Jun-1933, MRN [id number]  PCP: Tivis Ringer, MD Primary Cardiologist: Hochrein Electrophysiologist: [facility]red  CC: Pacemaker follow-up  Jamie Reese is a 82 y.o. female seen today for Dr Rayann Heman.  She presents today for routine electrophysiology followup.  Since last being seen in our clinic, the patient reports doing very well. She recently had back surgery which has helped with pain and is pending shoulder surgery.  She denies chest pain, palpitations, dyspnea, PND, orthopnea, nausea, vomiting, dizziness, syncope, edema, weight gain, or early satiety.  Device History: STJ dual chamber PPM implanted 2009 for complete heart block    Past Medical History  Diagnosis Date  . Complete heart block (HCC)     s/p PPM  . Coronary artery disease     s/p PCI  . Hyperlipidemia   . Irritable bowel syndrome   . Polymyalgia rheumatica ([place])   . H/O: hysterectomy   . S/P breast biopsy     secondary to nipple discharge  . Complication of anesthesia 2011    told that it as hard to pass tube when they intubated for pacemaker insertion   . Family history of adverse reaction to anesthesia     father hard to put to sleep but he also used a lot alcohol    . Hypertension     not treated with med.   . Presence of permanent cardiac pacemaker   . Giant cell arteritis (HCC)     polymyalgia rheumatica  . Asthma     as a child  . History of hiatal hernia   . Arthritis     R shoulder, both knees & low back- degenerative    Past Surgical History  Procedure Laterality Date  . Appendectomy    . Tonsillectomy    . Cholecystectomy    . Breast biopsy      secondary to nipple discharge  . Pilondial cyst resection    . Cesarean section    . Temporal artery biopsy / ligation    . Pacemaker insertion  11/15/2007    St. Jude Pacemaker Zephyr XL implanted in [place]  . Abdominal hysterectomy    . Eye surgery      cataracts  removed & IOL- both eyes   . Knee arthroscopy Left   . Insert / replace / remove pacemaker    . Cardiac catheterization  2001    x1stent - Alfred Little   . Lumbar laminectomy/decompression microdiscectomy Left 06/08/2015    Procedure: Left Lumbar Two-Three Microdiskectomy;  Surgeon: Kary Kos, MD;  Location: [facility];  Service: Neurosurgery;  Laterality: Left;  Left L2-3 Microdiskectomy    Current Outpatient Prescriptions  Medication Sig Dispense Refill  . aspirin 81 MG tablet Take 81 mg by mouth daily.      Marland Kitchen atorvastatin (LIPITOR) 20 MG tablet Take 20 mg by mouth daily.      . ergocalciferol (VITAMIN D2) 50000 UNITS capsule Take 50,000 Units by mouth every Wednesday.     . Multiple Vitamins-Minerals (PRESERVISION AREDS PO) Take 1 tablet by mouth daily.    . naproxen sodium (ANAPROX) 220 MG tablet Take 440 mg by mouth 2 (two) times daily as needed (for allergy symptoms).    . traMADol (ULTRAM) 50 MG tablet Take 50 mg by mouth 2 (two) times daily as needed (pain).      No current facility-administered medications for this visit.    Allergies:   Codeine;  Cymbalta; Hydrocodone; and Lyrica   Social History: Social History   Social History  . Marital Status: Married    Spouse Name: N/A  . Number of Children: N/A  . Years of Education: N/A   Occupational History  . Not on file.   Social History Main Topics  . Smoking status: Never Smoker   . Smokeless tobacco: Never Used  . Alcohol Use: No  . Drug Use: No  . Sexual Activity: Not on file   Other Topics Concern  . Not on file   Social History Narrative    Family History: Family History  Problem Relation Age of Onset  . Cancer    . Heart Problems Mother     PPM  . Heart attack Father 58     Review of Systems: All other systems reviewed and are otherwise negative except as noted above.   Physical Exam: VS:  BP 124/62 mmHg  Pulse 70  Ht 5' (1.524 m)  Wt 129 lb 3.2 oz (58.605 kg)  BMI 25.23 kg/m2 , BMI Body  mass index is 25.23 kg/(m^2).  GEN- The patient is elderly appearing, alert and oriented x 3 today.   HEENT: normocephalic, atraumatic; sclera clear, conjunctiva pink; hearing intact; oropharynx clear; neck supple  Lungs- Clear to ausculation bilaterally, normal work of breathing.  No wheezes, rales, rhonchi Heart- Regular rate and rhythm (paced) GI- soft, non-tender, non-distended, bowel sounds present  Extremities- no clubbing, cyanosis, or edema; DP/PT/radial pulses 2+ bilaterally MS- no significant deformity or atrophy Skin- warm and dry, no rash or lesion; PPM pocket well healed Psych- euthymic mood, full affect Neuro- strength and sensation are intact  PPM Interrogation- reviewed in detail today,  See PACEART report  EKG:  EKG is not ordered today.  Recent Labs: 06/01/2015: BUN 18; Creatinine, Ser 0.85; Hemoglobin 13.6; Platelets 250; Potassium 4.7; Sodium 141   Wt Readings from Last 3 Encounters:  08/07/15 129 lb 3.2 oz (58.605 kg)  06/08/15 126 lb (57.153 kg)  06/01/15 126 lb 11.2 oz (57.471 kg)     Other studies Reviewed: Additional studies/ records that were reviewed today include: Dr Percival Spanish and Dr Jackalyn Lombard office notes  Assessment and Plan:  1.  Complete heart block  Normal PPM function - pt is pacemaker dependent today  See Pace Art report No changes today  2.  HTN Stable No change required today  3.  CAD No recent ischemic symptoms Continue current therapy   Current medicines are reviewed at length with the patient today.   The patient does not have concerns regarding her medicines.  The following changes were made today:  none  Labs/ tests ordered today include: none   Disposition:   Follow up with Dr Percival Spanish as scheduled, device clinic 6 months, Dr Rayann Heman 1 year    Signed, Chanetta Marshall, NP 08/07/2015 12:04 PM  [facility] [address] [place] [address] [phone] (office) [phone] (fax)

## 2015-08-12 DIAGNOSIS — M5126 Other intervertebral disc displacement, lumbar region: Secondary | ICD-10-CM | POA: Diagnosis not present

## 2015-10-21 ENCOUNTER — Other Ambulatory Visit: Payer: Self-pay | Admitting: Internal Medicine

## 2015-10-21 DIAGNOSIS — Z1231 Encounter for screening mammogram for malignant neoplasm of breast: Secondary | ICD-10-CM

## 2015-11-10 DIAGNOSIS — M5137 Other intervertebral disc degeneration, lumbosacral region: Secondary | ICD-10-CM | POA: Diagnosis not present

## 2015-11-10 DIAGNOSIS — R030 Elevated blood-pressure reading, without diagnosis of hypertension: Secondary | ICD-10-CM | POA: Diagnosis not present

## 2015-11-11 ENCOUNTER — Other Ambulatory Visit: Payer: Self-pay | Admitting: Neurosurgery

## 2015-11-11 DIAGNOSIS — M5136 Other intervertebral disc degeneration, lumbar region: Secondary | ICD-10-CM

## 2015-11-18 ENCOUNTER — Ambulatory Visit
Admission: RE | Admit: 2015-11-18 | Discharge: 2015-11-18 | Disposition: A | Discharge status: Home / Self Care | Payer: Medicare Other | Source: Ambulatory Visit | Attending: Internal Medicine | Admitting: Internal Medicine

## 2015-11-18 ENCOUNTER — Ambulatory Visit
Admission: RE | Admit: 2015-11-18 | Discharge: 2015-11-18 | Disposition: A | Discharge status: Home / Self Care | Payer: Medicare Other | Source: Ambulatory Visit | Attending: Neurosurgery | Admitting: Neurosurgery

## 2015-11-18 DIAGNOSIS — Z1231 Encounter for screening mammogram for malignant neoplasm of breast: Secondary | ICD-10-CM | POA: Diagnosis not present

## 2015-11-18 DIAGNOSIS — S3992XA Unspecified injury of lower back, initial encounter: Secondary | ICD-10-CM | POA: Diagnosis not present

## 2015-11-18 DIAGNOSIS — M545 Low back pain: Secondary | ICD-10-CM | POA: Diagnosis not present

## 2015-11-18 DIAGNOSIS — M5136 Other intervertebral disc degeneration, lumbar region: Secondary | ICD-10-CM

## 2015-11-24 DIAGNOSIS — K449 Diaphragmatic hernia without obstruction or gangrene: Secondary | ICD-10-CM | POA: Diagnosis not present

## 2015-11-24 DIAGNOSIS — M5137 Other intervertebral disc degeneration, lumbosacral region: Secondary | ICD-10-CM | POA: Diagnosis not present

## 2015-11-24 DIAGNOSIS — E042 Nontoxic multinodular goiter: Secondary | ICD-10-CM | POA: Diagnosis not present

## 2015-11-24 DIAGNOSIS — F515 Nightmare disorder: Secondary | ICD-10-CM | POA: Diagnosis not present

## 2015-11-24 DIAGNOSIS — M1990 Unspecified osteoarthritis, unspecified site: Secondary | ICD-10-CM | POA: Diagnosis not present

## 2015-11-24 DIAGNOSIS — Z79899 Other long term (current) drug therapy: Secondary | ICD-10-CM | POA: Diagnosis not present

## 2015-11-24 DIAGNOSIS — R7302 Impaired glucose tolerance (oral): Secondary | ICD-10-CM | POA: Diagnosis not present

## 2015-11-24 DIAGNOSIS — Z9861 Coronary angioplasty status: Secondary | ICD-10-CM | POA: Diagnosis not present

## 2015-11-24 DIAGNOSIS — Z6824 Body mass index (BMI) 24.0-24.9, adult: Secondary | ICD-10-CM | POA: Diagnosis not present

## 2015-11-25 ENCOUNTER — Other Ambulatory Visit: Payer: Self-pay | Admitting: Internal Medicine

## 2015-11-25 DIAGNOSIS — F515 Nightmare disorder: Secondary | ICD-10-CM

## 2015-12-03 ENCOUNTER — Ambulatory Visit
Admission: RE | Admit: 2015-12-03 | Discharge: 2015-12-03 | Disposition: A | Discharge status: Home / Self Care | Payer: Medicare Other | Source: Ambulatory Visit | Attending: Internal Medicine | Admitting: Internal Medicine

## 2015-12-03 DIAGNOSIS — R930 Abnormal findings on diagnostic imaging of skull and head, not elsewhere classified: Secondary | ICD-10-CM | POA: Diagnosis not present

## 2015-12-03 DIAGNOSIS — F515 Nightmare disorder: Secondary | ICD-10-CM

## 2015-12-03 MED ORDER — IOPAMIDOL (ISOVUE-300) INJECTION 61%
75.0000 mL | Freq: Once | INTRAVENOUS | Status: AC | PRN
  Administered 2015-12-03: 75 mL via INTRAVENOUS

## 2016-01-05 ENCOUNTER — Encounter: Payer: Self-pay | Admitting: Cardiology

## 2016-01-05 ENCOUNTER — Ambulatory Visit (INDEPENDENT_AMBULATORY_CARE_PROVIDER_SITE_OTHER): Payer: Medicare Other | Admitting: Cardiology

## 2016-01-05 VITALS — BP 130/62 | HR 82 | Ht 60.5 in | Wt 130.2 lb

## 2016-01-05 DIAGNOSIS — I10 Essential (primary) hypertension: Secondary | ICD-10-CM

## 2016-01-05 DIAGNOSIS — I2510 Atherosclerotic heart disease of native coronary artery without angina pectoris: Secondary | ICD-10-CM

## 2016-01-05 NOTE — Patient Instructions (Addendum)
Medication Instructions:  Continue current medications  Labwork: None Ordered  Testing/Procedures: None Ordered  Follow-Up: Your physician wants you to follow-up in: 1 Year. You will receive a reminder letter in the mail two months in advance. If you don't receive a letter, please call our office to schedule the follow-up appointment.   Any Other Special Instructions Will Be Listed Below (If Applicable).  Jamie Reese Address: [address] [place], [place] [address]  Phone: [phone]    If you need a refill on your cardiac medications before your next appointment, please call your pharmacy.

## 2016-01-05 NOTE — Progress Notes (Signed)
HPI  Patient presents for followup of coronary disease. Since I last saw her she has done OK from a cardiac standpoint.   She did have cervical neck surgery and had improvement in symptoms she was describing last year. She is now again having some shoulder surgery for replacement. She has remained active doing walking which includes hills in her neighborhood. She does vacuuming and other activities. The patient denies any new symptoms such as chest discomfort, neck or arm discomfort. There has been no new shortness of breath, PND or orthopnea. There have been no reported palpitations, presyncope or syncope.   Allergies  Allergen Reactions  . Codeine Nausea And Vomiting and Other (See Comments)    Also passed out  . Cymbalta [Duloxetine Hcl] Nausea And Vomiting and Other (See Comments)    Also passed out  . Hydrocodone     SOB, passed out feeling, nausea   . Lyrica [Pregabalin] Nausea And Vomiting    Current Outpatient Prescriptions  Medication Sig Dispense Refill  . aspirin 81 MG tablet Take 81 mg by mouth daily.      Marland Kitchen atorvastatin (LIPITOR) 20 MG tablet Take 20 mg by mouth daily.      . ergocalciferol (VITAMIN D2) 50000 UNITS capsule Take 50,000 Units by mouth every Wednesday.     . Multiple Vitamins-Minerals (PRESERVISION AREDS PO) Take 1 tablet by mouth daily.    . traMADol (ULTRAM) 50 MG tablet Take 50 mg by mouth 2 (two) times daily as needed (pain).      No current facility-administered medications for this visit.     Past Medical History:  Diagnosis Date  . Arthritis    R shoulder, both knees & low back- degenerative   . Asthma    as a child  . Complete heart block ([place])    s/p PPM  . Complication of anesthesia 2011   told that it as hard to pass tube when they intubated for pacemaker insertion   . Coronary artery disease    s/p PCI  . Family history of adverse reaction to anesthesia    father hard to put to sleep but he also used a lot alcohol    . Giant cell  arteritis (HCC)    polymyalgia rheumatica  . H/O: hysterectomy   . History of hiatal hernia   . Hyperlipidemia   . Hypertension    not treated with med.   . Irritable bowel syndrome   . Polymyalgia rheumatica ([place])   . Presence of permanent cardiac pacemaker   . S/P breast biopsy    secondary to nipple discharge    Past Surgical History:  Procedure Laterality Date  . ABDOMINAL HYSTERECTOMY    . APPENDECTOMY    . BREAST BIOPSY     secondary to nipple discharge  . CARDIAC CATHETERIZATION  2001   x1stent - Alfred Little   . CESAREAN SECTION    . CHOLECYSTECTOMY    . EYE SURGERY     cataracts removed & IOL- both eyes   . INSERT / REPLACE / REMOVE PACEMAKER    . KNEE ARTHROSCOPY Left   . LUMBAR LAMINECTOMY/DECOMPRESSION MICRODISCECTOMY Left 06/08/2015   Procedure: Left Lumbar Two-Three Microdiskectomy;  Surgeon: Kary Kos, MD;  Location: [facility];  Service: Neurosurgery;  Laterality: Left;  Left L2-3 Microdiskectomy  . PACEMAKER INSERTION  11/15/2007   St. Jude Pacemaker Zephyr XL implanted in [place]  . pilondial cyst resection    . TEMPORAL ARTERY BIOPSY / LIGATION    .  TONSILLECTOMY      ROS:   As stated in the HPI and negative for all other systems.  PHYSICAL EXAM BP 130/62 (BP Location: Left Arm, Patient Position: Sitting, Cuff Size: Normal)   Pulse 82   Ht 5' 0.5" (1.537 m)   Wt 130 lb 4 oz (59.1 kg)   BMI 25.02 kg/m  GENERAL:  Well appearing NECK:  No jugular venous distention, waveform within normal limits, carotid upstroke brisk and symmetric, no bruits, no thyromegaly LUNGS:  Clear to auscultation bilaterally BACK: No CVA tenderness CHEST:  Pacemaker pocket HEART:  PMI not displaced or sustained,S1 and S2 within normal limits, no S3, no S4, no clicks, no rubs, no murmurs ABD:  Flat, positive bowel sounds normal in frequency in pitch, no bruits, no rebound, no guarding, no midline pulsatile mass, no hepatomegaly, no splenomegaly EXT:  2 plus pulses throughout, no  edema, no cyanosis no clubbing  EKG:   AV paced rhythm rate 70.  06/01/15  ASSESSMENT AND PLAN  PREOP:  The patient is a high functional level. She has no symptoms. She has no high-risk findings. She is not going for surgery that is high risk from surgical standpoint. Therefore, she will be at acceptable risk for cardiovascular surgery according to ACC/AHA guidelines.  SLEEP WALKING - She is acting out in her sleep.  I will refer her to Dr. Brett Fairy.   CAD -  She had a low risk stress perfusion study in Dec 2012.  No further testing is indicated. She will continue with risk reduction.  HYPERTENSION -  The blood pressure is at target for her age.  No change in medications is indicated. We will continue with therapeutic lifestyle changes (TLC).   PACEMAKER, PERMANENT -  She will continue to be followed in the Pacemaker clinic.   PALPIT ATIONS - These are not particularly symptomatic. No change in therapy is indicated.  RISK REDUCTION - This is followed by Tivis Ringer, MD.

## 2016-02-18 ENCOUNTER — Ambulatory Visit (INDEPENDENT_AMBULATORY_CARE_PROVIDER_SITE_OTHER): Payer: Medicare Other | Admitting: *Deleted

## 2016-02-18 DIAGNOSIS — I442 Atrioventricular block, complete: Secondary | ICD-10-CM

## 2016-02-18 LAB — CUP PACEART INCLINIC DEVICE CHECK
Battery Impedance: 5400 Ohm
Battery Voltage: 2.73 V
Date Time Interrogation Session: 20171116132521
Implantable Lead Implant Date: 20090813
Implantable Lead Location: 753859
Lead Channel Impedance Value: 462 Ohm
Lead Channel Pacing Threshold Pulse Width: 0.5 ms
Lead Channel Pacing Threshold Pulse Width: 0.5 ms
Lead Channel Setting Pacing Pulse Width: 0.5 ms
Lead Channel Setting Sensing Sensitivity: 2 mV
MDC IDC LEAD IMPLANT DT: 20090813
MDC IDC LEAD LOCATION: 753860
MDC IDC MSMT LEADCHNL RA PACING THRESHOLD AMPLITUDE: 0.5 V
MDC IDC MSMT LEADCHNL RA SENSING INTR AMPL: 2.1 mV
MDC IDC MSMT LEADCHNL RV IMPEDANCE VALUE: 768 Ohm
MDC IDC MSMT LEADCHNL RV PACING THRESHOLD AMPLITUDE: 0.75 V
MDC IDC PG IMPLANT DT: 20090813
MDC IDC PG SERIAL: 1240201
Pulse Gen Model: 5826

## 2016-02-18 NOTE — Progress Notes (Signed)
Pacemaker check in clinic. Normal device function. Thresholds, sensing, impedances consistent with previous measurements. Device programmed to maximize longevity. No mode switch or high ventricular rates noted. Device programmed at appropriate safety margins. Histogram distribution appropriate for patient activity level. Device programmed to optimize intrinsic conduction. Estimated longevity 1.75-57yrs. ROV with JA 08/2016. Patient education completed.

## 2016-03-01 DIAGNOSIS — H9190 Unspecified hearing loss, unspecified ear: Secondary | ICD-10-CM | POA: Diagnosis not present

## 2016-03-01 DIAGNOSIS — Z6825 Body mass index (BMI) 25.0-25.9, adult: Secondary | ICD-10-CM | POA: Diagnosis not present

## 2016-03-01 DIAGNOSIS — H6123 Impacted cerumen, bilateral: Secondary | ICD-10-CM | POA: Diagnosis not present

## 2016-05-20 DIAGNOSIS — E042 Nontoxic multinodular goiter: Secondary | ICD-10-CM | POA: Diagnosis not present

## 2016-05-20 DIAGNOSIS — R7302 Impaired glucose tolerance (oral): Secondary | ICD-10-CM | POA: Diagnosis not present

## 2016-05-20 DIAGNOSIS — Z0000 Encounter for general adult medical examination without abnormal findings: Secondary | ICD-10-CM | POA: Diagnosis not present

## 2016-05-20 DIAGNOSIS — E559 Vitamin D deficiency, unspecified: Secondary | ICD-10-CM | POA: Diagnosis not present

## 2016-05-20 DIAGNOSIS — E784 Other hyperlipidemia: Secondary | ICD-10-CM | POA: Diagnosis not present

## 2016-05-30 DIAGNOSIS — Z6825 Body mass index (BMI) 25.0-25.9, adult: Secondary | ICD-10-CM | POA: Diagnosis not present

## 2016-05-30 DIAGNOSIS — R7302 Impaired glucose tolerance (oral): Secondary | ICD-10-CM | POA: Diagnosis not present

## 2016-05-30 DIAGNOSIS — F515 Nightmare disorder: Secondary | ICD-10-CM | POA: Diagnosis not present

## 2016-05-30 DIAGNOSIS — M1990 Unspecified osteoarthritis, unspecified site: Secondary | ICD-10-CM | POA: Diagnosis not present

## 2016-05-30 DIAGNOSIS — Z1389 Encounter for screening for other disorder: Secondary | ICD-10-CM | POA: Diagnosis not present

## 2016-05-30 DIAGNOSIS — K449 Diaphragmatic hernia without obstruction or gangrene: Secondary | ICD-10-CM | POA: Diagnosis not present

## 2016-05-30 DIAGNOSIS — Z9861 Coronary angioplasty status: Secondary | ICD-10-CM | POA: Diagnosis not present

## 2016-05-30 DIAGNOSIS — E784 Other hyperlipidemia: Secondary | ICD-10-CM | POA: Diagnosis not present

## 2016-05-30 DIAGNOSIS — E042 Nontoxic multinodular goiter: Secondary | ICD-10-CM | POA: Diagnosis not present

## 2016-05-30 DIAGNOSIS — Z0000 Encounter for general adult medical examination without abnormal findings: Secondary | ICD-10-CM | POA: Diagnosis not present

## 2016-05-30 DIAGNOSIS — M546 Pain in thoracic spine: Secondary | ICD-10-CM | POA: Diagnosis not present

## 2016-05-30 DIAGNOSIS — M810 Age-related osteoporosis without current pathological fracture: Secondary | ICD-10-CM | POA: Diagnosis not present

## 2016-06-07 DIAGNOSIS — E559 Vitamin D deficiency, unspecified: Secondary | ICD-10-CM | POA: Diagnosis not present

## 2016-06-07 DIAGNOSIS — M810 Age-related osteoporosis without current pathological fracture: Secondary | ICD-10-CM | POA: Diagnosis not present

## 2016-07-08 DIAGNOSIS — H353132 Nonexudative age-related macular degeneration, bilateral, intermediate dry stage: Secondary | ICD-10-CM | POA: Diagnosis not present

## 2016-07-08 DIAGNOSIS — Z961 Presence of intraocular lens: Secondary | ICD-10-CM | POA: Diagnosis not present

## 2016-07-08 DIAGNOSIS — H5212 Myopia, left eye: Secondary | ICD-10-CM | POA: Diagnosis not present

## 2016-10-07 ENCOUNTER — Other Ambulatory Visit: Payer: Self-pay | Admitting: Internal Medicine

## 2016-10-07 DIAGNOSIS — Z1231 Encounter for screening mammogram for malignant neoplasm of breast: Secondary | ICD-10-CM

## 2016-11-21 ENCOUNTER — Ambulatory Visit: Payer: Medicare Other

## 2016-11-28 ENCOUNTER — Ambulatory Visit
Admission: RE | Admit: 2016-11-28 | Discharge: 2016-11-28 | Disposition: A | Discharge status: Home / Self Care | Payer: Medicare Other | Source: Ambulatory Visit | Attending: Internal Medicine | Admitting: Internal Medicine

## 2016-11-28 DIAGNOSIS — Z1231 Encounter for screening mammogram for malignant neoplasm of breast: Secondary | ICD-10-CM

## 2016-11-30 DIAGNOSIS — Z9861 Coronary angioplasty status: Secondary | ICD-10-CM | POA: Diagnosis not present

## 2016-11-30 DIAGNOSIS — M1990 Unspecified osteoarthritis, unspecified site: Secondary | ICD-10-CM | POA: Diagnosis not present

## 2016-11-30 DIAGNOSIS — J3089 Other allergic rhinitis: Secondary | ICD-10-CM | POA: Diagnosis not present

## 2016-11-30 DIAGNOSIS — E784 Other hyperlipidemia: Secondary | ICD-10-CM | POA: Diagnosis not present

## 2016-11-30 DIAGNOSIS — Z6825 Body mass index (BMI) 25.0-25.9, adult: Secondary | ICD-10-CM | POA: Diagnosis not present

## 2016-11-30 DIAGNOSIS — R7302 Impaired glucose tolerance (oral): Secondary | ICD-10-CM | POA: Diagnosis not present

## 2016-11-30 DIAGNOSIS — M810 Age-related osteoporosis without current pathological fracture: Secondary | ICD-10-CM | POA: Diagnosis not present

## 2016-11-30 DIAGNOSIS — K5909 Other constipation: Secondary | ICD-10-CM | POA: Diagnosis not present

## 2016-12-26 ENCOUNTER — Encounter: Payer: Self-pay | Admitting: Internal Medicine

## 2016-12-26 ENCOUNTER — Ambulatory Visit (INDEPENDENT_AMBULATORY_CARE_PROVIDER_SITE_OTHER): Payer: Medicare Other | Admitting: Internal Medicine

## 2016-12-26 ENCOUNTER — Encounter (INDEPENDENT_AMBULATORY_CARE_PROVIDER_SITE_OTHER): Payer: Self-pay

## 2016-12-26 VITALS — BP 152/70 | HR 80 | Ht 60.0 in | Wt 130.0 lb

## 2016-12-26 DIAGNOSIS — I10 Essential (primary) hypertension: Secondary | ICD-10-CM

## 2016-12-26 DIAGNOSIS — I442 Atrioventricular block, complete: Secondary | ICD-10-CM | POA: Diagnosis not present

## 2016-12-26 LAB — CUP PACEART INCLINIC DEVICE CHECK
Battery Impedance: 20100 Ohm
Battery Remaining Longevity: 3 mo
Battery Voltage: 2.61 V
Brady Statistic RV Percent Paced: 99 %
Implantable Lead Location: 753859
Lead Channel Impedance Value: 416 Ohm
Lead Channel Pacing Threshold Amplitude: 0.5 V
Lead Channel Sensing Intrinsic Amplitude: 2.5 mV
Lead Channel Setting Pacing Amplitude: 2 V
Lead Channel Setting Pacing Pulse Width: 0.5 ms
Lead Channel Setting Sensing Sensitivity: 2 mV
MDC IDC LEAD IMPLANT DT: 20090813
MDC IDC LEAD IMPLANT DT: 20090813
MDC IDC LEAD LOCATION: 753860
MDC IDC MSMT LEADCHNL RA PACING THRESHOLD PULSEWIDTH: 0.5 ms
MDC IDC MSMT LEADCHNL RV IMPEDANCE VALUE: 703 Ohm
MDC IDC MSMT LEADCHNL RV PACING THRESHOLD AMPLITUDE: 0.75 V
MDC IDC MSMT LEADCHNL RV PACING THRESHOLD PULSEWIDTH: 0.5 ms
MDC IDC PG IMPLANT DT: 20090813
MDC IDC PG SERIAL: 1240201
MDC IDC SESS DTM: 20180924130847
MDC IDC SET LEADCHNL RA PACING AMPLITUDE: 2 V
MDC IDC STAT BRADY RA PERCENT PACED: 86 %
Pulse Gen Model: 5826

## 2016-12-26 NOTE — Progress Notes (Signed)
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  Since last being seen in our clinic, the patient reports doing reasonably well. She has rare palpitations.  Today, she denies symptoms of palpitations, chest pain, shortness of breath,  lower extremity edema, dizziness, presyncope, or syncope.  The patient is otherwise without complaint today.   Past Medical History:  Diagnosis Date  . Arthritis    R shoulder, both knees & low back- degenerative   . Asthma    as a child  . Complete heart block ([place])    s/p PPM  . Complication of anesthesia 2011   told that it as hard to pass tube when they intubated for pacemaker insertion   . Coronary artery disease    s/p PCI  . Family history of adverse reaction to anesthesia    father hard to put to sleep but he also used a lot alcohol    . Giant cell arteritis (HCC)    polymyalgia rheumatica  . H/O: hysterectomy   . History of hiatal hernia   . Hyperlipidemia   . Hypertension    not treated with med.   . Irritable bowel syndrome   . Polymyalgia rheumatica ([place])   . Presence of permanent cardiac pacemaker   . S/P breast biopsy    secondary to nipple discharge   Past Surgical History:  Procedure Laterality Date  . ABDOMINAL HYSTERECTOMY    . APPENDECTOMY    . BREAST BIOPSY     secondary to nipple discharge  . CARDIAC CATHETERIZATION  2001   x1stent - Alfred Little   . CESAREAN SECTION    . CHOLECYSTECTOMY    . EYE SURGERY     cataracts removed & IOL- both eyes   . INSERT / REPLACE / REMOVE PACEMAKER    . KNEE ARTHROSCOPY Left   . LUMBAR LAMINECTOMY/DECOMPRESSION MICRODISCECTOMY Left 06/08/2015   Procedure: Left Lumbar Two-Three Microdiskectomy;  Surgeon: Kary Kos, MD;  Location: [facility];  Service: Neurosurgery;  Laterality: Left;  Left L2-3 Microdiskectomy  . PACEMAKER INSERTION  11/15/2007   St. Jude  Pacemaker Zephyr XL implanted in [place]  . pilondial cyst resection    . TEMPORAL ARTERY BIOPSY / LIGATION    . TONSILLECTOMY      ROS- all systems are reviewed and negative except as per HPI above  Current Outpatient Prescriptions  Medication Sig Dispense Refill  . aspirin 81 MG tablet Take 81 mg by mouth daily.      Marland Kitchen atorvastatin (LIPITOR) 20 MG tablet Take 20 mg by mouth daily.      . ergocalciferol (VITAMIN D2) 50000 UNITS capsule Take 50,000 Units by mouth every Wednesday.     . Multiple Vitamins-Minerals (PRESERVISION AREDS PO) Take 1 tablet by mouth daily.    . traMADol (ULTRAM) 50 MG tablet Take 50 mg by mouth 2 (two) times daily as needed (pain).      No current facility-administered medications for this visit.     Physical Exam: Vitals:   12/26/16 1204  BP: (!) 152/70  Pulse: 80  SpO2: 100%  Weight: 130 lb (59 kg)  Height: 5' (1.524 m)    GEN- The patient is thin and elderly appearing, alert and oriented x 3 today.   Head- normocephalic, atraumatic Eyes-  Sclera clear, conjunctiva pink Ears- hearing intact Oropharynx- clear Lungs- Clear to ausculation bilaterally, normal work  of breathing Chest- pacemaker pocket is well healed Heart- Regular rate and rhythm, no murmurs, rubs or gallops, PMI not laterally displaced GI- soft, NT, ND, + BS Extremities- no clubbing, cyanosis, or edema  Pacemaker interrogation- reviewed in detail today,  See PACEART report  ekg tracing ordered today is personally reviewed and shows sinus rhythm with V pacing  Assessment and Plan:  1. Symptomatic complete heart block Pacemaker is at [facility] Risks, benefits, and alternatives to pacemaker pulse generator replacement were discussed in detail today.  The patient understands that risks include but are not limited to bleeding, infection, pneumothorax, perforation, tamponade, vascular damage, renal failure, MI, stroke, death,  damage to his existing leads, and lead dislodgement and wishes to  proceed.  We will therefore schedule the procedure at the next available time.  Will plan to proceed tomorrow afternoon.  See Pace Art report No changes today  2. HTN Stable No change required today  3. CAD No ischemic symptoms No changes  Thompson Grayer MD, [facility] 12/26/2016 12:29 PM

## 2016-12-26 NOTE — Patient Instructions (Addendum)
Medication Instructions: Your physician recommends that you continue on your current medications as directed. Please refer to the Current Medication list given to you today.  Labwork: Your physician recommends that you have lab work today: BMET and CBC   Procedures/Testing: Your physician has recommended that you have a pacemaker generator changed. A pacemaker is a small device that is placed under the skin of your chest or abdomen to help control abnormal heart rhythms. This device uses electrical pulses to prompt the heart to beat at a normal rate. Pacemakers are used to treat heart rhythms that are too slow. Wire (leads) are attached to the pacemaker that goes into the chambers of you heart. This is done in the hospital and usually requires and overnight stay. Please see the instruction sheet given to you today for more information.   Follow-Up: Your physician recommends that you schedule a follow-up appointment in: 10-14 days with the [facility] for a wound check  Your physician recommends that you schedule a follow-up appointment in: 3 months with Dr. Rayann Heman   Any Additional Special Instructions Will Be Listed Below (If Applicable).  - Please arrive at the [facility] at 11:30 am on 12/27/16 - You may have a light breakfast the morning of your procedure - Please do not take your Aspirin the morning of your procedure - Have someone to drive you home - Plan for an overnight stay   If you need a refill on your cardiac medications before your next appointment, please call your pharmacy.

## 2016-12-27 ENCOUNTER — Ambulatory Visit (HOSPITAL_COMMUNITY)
Admission: RE | Disposition: A | Discharge status: Home / Self Care | Payer: Self-pay | Source: Ambulatory Visit | Attending: Internal Medicine

## 2016-12-27 ENCOUNTER — Ambulatory Visit (HOSPITAL_COMMUNITY)
Admission: RE | Admit: 2016-12-27 | Discharge: 2016-12-27 | Disposition: A | Discharge status: Home / Self Care | Payer: Medicare Other | Source: Ambulatory Visit | Attending: Internal Medicine | Admitting: Internal Medicine

## 2016-12-27 DIAGNOSIS — K589 Irritable bowel syndrome without diarrhea: Secondary | ICD-10-CM | POA: Diagnosis not present

## 2016-12-27 DIAGNOSIS — J45909 Unspecified asthma, uncomplicated: Secondary | ICD-10-CM | POA: Insufficient documentation

## 2016-12-27 DIAGNOSIS — I442 Atrioventricular block, complete: Secondary | ICD-10-CM | POA: Diagnosis not present

## 2016-12-27 DIAGNOSIS — I2510 Atherosclerotic heart disease of native coronary artery without angina pectoris: Secondary | ICD-10-CM | POA: Diagnosis not present

## 2016-12-27 DIAGNOSIS — I10 Essential (primary) hypertension: Secondary | ICD-10-CM | POA: Diagnosis not present

## 2016-12-27 DIAGNOSIS — M353 Polymyalgia rheumatica: Secondary | ICD-10-CM | POA: Diagnosis not present

## 2016-12-27 DIAGNOSIS — E785 Hyperlipidemia, unspecified: Secondary | ICD-10-CM | POA: Diagnosis not present

## 2016-12-27 DIAGNOSIS — Z45010 Encounter for checking and testing of cardiac pacemaker pulse generator [battery]: Secondary | ICD-10-CM | POA: Diagnosis not present

## 2016-12-27 DIAGNOSIS — Z7982 Long term (current) use of aspirin: Secondary | ICD-10-CM | POA: Diagnosis not present

## 2016-12-27 HISTORY — PX: PPM GENERATOR CHANGEOUT: EP1233

## 2016-12-27 LAB — BASIC METABOLIC PANEL
BUN / CREAT RATIO: 17 (ref 12–28)
BUN: 13 mg/dL (ref 8–27)
CHLORIDE: 101 mmol/L (ref 96–106)
CO2: 26 mmol/L (ref 20–29)
CREATININE: 0.78 mg/dL (ref 0.57–1.00)
Calcium: 9.6 mg/dL (ref 8.7–10.3)
GFR calc Af Amer: 82 mL/min/{1.73_m2} (ref >59)
GFR calc non Af Amer: 72 mL/min/{1.73_m2} (ref >59)
Glucose: 112 mg/dL — ABNORMAL HIGH (ref 65–99)
Potassium: 4.4 mmol/L (ref 3.5–5.2)
Sodium: 141 mmol/L (ref 134–144)

## 2016-12-27 LAB — CBC WITH DIFFERENTIAL/PLATELET
Basophils Absolute: 0 10*3/uL (ref 0.0–0.2)
Basos: 0 %
EOS (ABSOLUTE): 0 10*3/uL (ref 0.0–0.4)
EOS: 1 %
HEMATOCRIT: 40.6 % (ref 34.0–46.6)
Hemoglobin: 13.5 g/dL (ref 11.1–15.9)
Immature Grans (Abs): 0 10*3/uL (ref 0.0–0.1)
Immature Granulocytes: 0 %
LYMPHS ABS: 1.9 10*3/uL (ref 0.7–3.1)
Lymphs: 46 %
MCH: 29.5 pg (ref 26.6–33.0)
MCHC: 33.3 g/dL (ref 31.5–35.7)
MCV: 89 fL (ref 79–97)
MONOCYTES: 4 %
Monocytes Absolute: 0.2 10*3/uL (ref 0.1–0.9)
Neutrophils Absolute: 2.1 10*3/uL (ref 1.4–7.0)
Neutrophils: 49 %
Platelets: 215 10*3/uL (ref 150–379)
RBC: 4.57 x10E6/uL (ref 3.77–5.28)
RDW: 13.9 % (ref 12.3–15.4)
WBC: 4.1 10*3/uL (ref 3.4–10.8)

## 2016-12-27 LAB — SURGICAL PCR SCREEN
MRSA, PCR: NEGATIVE
Staphylococcus aureus: NEGATIVE

## 2016-12-27 SURGERY — PPM GENERATOR CHANGEOUT
Anesthesia: LOCAL

## 2016-12-27 MED ORDER — SODIUM CHLORIDE 0.9% FLUSH: 3.0000 mL | Freq: Two times a day (BID) | INTRAVENOUS | Status: DC

## 2016-12-27 MED ORDER — MUPIROCIN 2 % EX OINT
TOPICAL_OINTMENT | CUTANEOUS | Status: AC
  Administered 2016-12-27: 1
  Filled 2016-12-27: qty 22

## 2016-12-27 MED ORDER — CEFAZOLIN SODIUM-DEXTROSE 2-4 GM/100ML-% IV SOLN
2.0000 g | INTRAVENOUS | Status: AC
  Administered 2016-12-27: 2 g via INTRAVENOUS

## 2016-12-27 MED ORDER — SODIUM CHLORIDE 0.9 % IR SOLN
Status: AC
  Filled 2016-12-27: qty 2

## 2016-12-27 MED ORDER — LIDOCAINE HCL (PF) 1 % IJ SOLN: INTRAMUSCULAR | Status: DC | PRN

## 2016-12-27 MED ORDER — SODIUM CHLORIDE 0.9% FLUSH: 3.0000 mL | INTRAVENOUS | Status: DC | PRN

## 2016-12-27 MED ORDER — SODIUM CHLORIDE 0.9 % IV SOLN: 250.0000 mL | INTRAVENOUS | Status: DC | PRN

## 2016-12-27 MED ORDER — BUPIVACAINE HCL (PF) 0.25 % IJ SOLN
INTRAMUSCULAR | Status: AC
  Filled 2016-12-27: qty 60

## 2016-12-27 MED ORDER — ONDANSETRON HCL 4 MG/2ML IJ SOLN: 4.0000 mg | Freq: Four times a day (QID) | INTRAMUSCULAR | Status: DC | PRN

## 2016-12-27 MED ORDER — CEFAZOLIN SODIUM-DEXTROSE 2-4 GM/100ML-% IV SOLN
INTRAVENOUS | Status: AC
  Filled 2016-12-27: qty 100

## 2016-12-27 MED ORDER — BUPIVACAINE HCL (PF) 0.25 % IJ SOLN
INTRAMUSCULAR | Status: DC | PRN
  Administered 2016-12-27: 35 mL

## 2016-12-27 MED ORDER — ACETAMINOPHEN 325 MG PO TABS: 325.0000 mg | ORAL_TABLET | ORAL | Status: DC | PRN

## 2016-12-27 MED ORDER — SODIUM CHLORIDE 0.9 % IV SOLN
INTRAVENOUS | Status: DC
  Administered 2016-12-27: 12:00:00 via INTRAVENOUS

## 2016-12-27 MED ORDER — SODIUM CHLORIDE 0.9 % IR SOLN
80.0000 mg | Status: AC
  Administered 2016-12-27: 80 mg

## 2016-12-27 MED ORDER — CHLORHEXIDINE GLUCONATE 4 % EX LIQD: 60.0000 mL | Freq: Once | CUTANEOUS | Status: DC

## 2016-12-27 SURGICAL SUPPLY — 4 items
CABLE SURGICAL S-101-97-12 (CABLE) ×1 IMPLANT
PACEMAKER ASSURITY DR-RF (Pacemaker) ×1 IMPLANT
PAD DEFIB LIFELINK (PAD) ×1 IMPLANT
TRAY PACEMAKER INSERTION (PACKS) ×1 IMPLANT

## 2016-12-27 NOTE — H&P (View-Only) (Signed)
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  Since last being seen in our clinic, the patient reports doing reasonably well. She has rare palpitations.  Today, she denies symptoms of palpitations, chest pain, shortness of breath,  lower extremity edema, dizziness, presyncope, or syncope.  The patient is otherwise without complaint today.   Past Medical History:  Diagnosis Date  . Arthritis    R shoulder, both knees & low back- degenerative   . Asthma    as a child  . Complete heart block ([place])    s/p PPM  . Complication of anesthesia 2011   told that it as hard to pass tube when they intubated for pacemaker insertion   . Coronary artery disease    s/p PCI  . Family history of adverse reaction to anesthesia    father hard to put to sleep but he also used a lot alcohol    . Giant cell arteritis (HCC)    polymyalgia rheumatica  . H/O: hysterectomy   . History of hiatal hernia   . Hyperlipidemia   . Hypertension    not treated with med.   . Irritable bowel syndrome   . Polymyalgia rheumatica ([place])   . Presence of permanent cardiac pacemaker   . S/P breast biopsy    secondary to nipple discharge   Past Surgical History:  Procedure Laterality Date  . ABDOMINAL HYSTERECTOMY    . APPENDECTOMY    . BREAST BIOPSY     secondary to nipple discharge  . CARDIAC CATHETERIZATION  2001   x1stent - Alfred Little   . CESAREAN SECTION    . CHOLECYSTECTOMY    . EYE SURGERY     cataracts removed & IOL- both eyes   . INSERT / REPLACE / REMOVE PACEMAKER    . KNEE ARTHROSCOPY Left   . LUMBAR LAMINECTOMY/DECOMPRESSION MICRODISCECTOMY Left 06/08/2015   Procedure: Left Lumbar Two-Three Microdiskectomy;  Surgeon: Kary Kos, MD;  Location: [facility];  Service: Neurosurgery;  Laterality: Left;  Left L2-3 Microdiskectomy  . PACEMAKER INSERTION  11/15/2007   St. Jude  Pacemaker Zephyr XL implanted in [place]  . pilondial cyst resection    . TEMPORAL ARTERY BIOPSY / LIGATION    . TONSILLECTOMY      ROS- all systems are reviewed and negative except as per HPI above  Current Outpatient Prescriptions  Medication Sig Dispense Refill  . aspirin 81 MG tablet Take 81 mg by mouth daily.      Marland Kitchen atorvastatin (LIPITOR) 20 MG tablet Take 20 mg by mouth daily.      . ergocalciferol (VITAMIN D2) 50000 UNITS capsule Take 50,000 Units by mouth every Wednesday.     . Multiple Vitamins-Minerals (PRESERVISION AREDS PO) Take 1 tablet by mouth daily.    . traMADol (ULTRAM) 50 MG tablet Take 50 mg by mouth 2 (two) times daily as needed (pain).      No current facility-administered medications for this visit.     Physical Exam: Vitals:   12/26/16 1204  BP: (!) 152/70  Pulse: 80  SpO2: 100%  Weight: 130 lb (59 kg)  Height: 5' (1.524 m)    GEN- The patient is thin and elderly appearing, alert and oriented x 3 today.   Head- normocephalic, atraumatic Eyes-  Sclera clear, conjunctiva pink Ears- hearing intact Oropharynx- clear Lungs- Clear to ausculation bilaterally, normal work  of breathing Chest- pacemaker pocket is well healed Heart- Regular rate and rhythm, no murmurs, rubs or gallops, PMI not laterally displaced GI- soft, NT, ND, + BS Extremities- no clubbing, cyanosis, or edema  Pacemaker interrogation- reviewed in detail today,  See PACEART report  ekg tracing ordered today is personally reviewed and shows sinus rhythm with V pacing  Assessment and Plan:  1. Symptomatic complete heart block Pacemaker is at [facility] Risks, benefits, and alternatives to pacemaker pulse generator replacement were discussed in detail today.  The patient understands that risks include but are not limited to bleeding, infection, pneumothorax, perforation, tamponade, vascular damage, renal failure, MI, stroke, death,  damage to his existing leads, and lead dislodgement and wishes to  proceed.  We will therefore schedule the procedure at the next available time.  Will plan to proceed tomorrow afternoon.  See Pace Art report No changes today  2. HTN Stable No change required today  3. CAD No ischemic symptoms No changes  Thompson Grayer MD, [facility] 12/26/2016 12:29 PM

## 2016-12-27 NOTE — Discharge Instructions (Signed)

## 2016-12-27 NOTE — Progress Notes (Signed)
Pt ambulated to bathroom, voided.  Tolerated well.  Left upper chest 2x2 guaze with tegaderm clean dry intact.

## 2016-12-27 NOTE — Interval H&P Note (Signed)
History and Physical Interval Note:  12/27/2016 12:25 PM  Jamie Reese  has presented today for surgery, with the diagnosis of ppm eri  The various methods of treatment have been discussed with the patient and family. After consideration of risks, benefits and other options for treatment, the patient has consented to  Procedure(s): PPM GENERATOR CHANGEOUT (N/A) as a surgical intervention .  The patient's history has been reviewed, patient examined, no change in status, stable for surgery.  I have reviewed the patient's chart and labs.  Questions were answered to the patient's satisfaction.     Thompson Grayer

## 2016-12-28 ENCOUNTER — Encounter (HOSPITAL_COMMUNITY): Payer: Self-pay | Admitting: Internal Medicine

## 2016-12-28 MED FILL — Sodium Chloride Irrigation Soln 0.9%: Qty: 500 | Status: AC

## 2016-12-28 MED FILL — Gentamicin Sulfate Inj 40 MG/ML: INTRAMUSCULAR | Qty: 2 | Status: AC

## 2016-12-28 MED FILL — Cefazolin Sodium-Dextrose IV Solution 2 GM/100ML-4%: INTRAVENOUS | Qty: 100 | Status: AC

## 2017-01-09 ENCOUNTER — Ambulatory Visit (INDEPENDENT_AMBULATORY_CARE_PROVIDER_SITE_OTHER): Payer: Medicare Other | Admitting: *Deleted

## 2017-01-09 DIAGNOSIS — I442 Atrioventricular block, complete: Secondary | ICD-10-CM

## 2017-01-09 DIAGNOSIS — Z950 Presence of cardiac pacemaker: Secondary | ICD-10-CM | POA: Diagnosis not present

## 2017-01-09 LAB — CUP PACEART INCLINIC DEVICE CHECK
Battery Voltage: 3.01 V
Brady Statistic RA Percent Paced: 34 %
Implantable Lead Implant Date: 20090813
Implantable Lead Location: 753859
Implantable Pulse Generator Implant Date: 20180925
Lead Channel Impedance Value: 437.5 Ohm
Lead Channel Impedance Value: 625 Ohm
Lead Channel Pacing Threshold Pulse Width: 0.5 ms
Lead Channel Setting Pacing Amplitude: 2 V
Lead Channel Setting Pacing Amplitude: 2.5 V
Lead Channel Setting Sensing Sensitivity: 4 mV
MDC IDC LEAD IMPLANT DT: 20090813
MDC IDC LEAD LOCATION: 753860
MDC IDC MSMT BATTERY REMAINING LONGEVITY: 109 mo
MDC IDC MSMT LEADCHNL RA PACING THRESHOLD AMPLITUDE: 0.5 V
MDC IDC MSMT LEADCHNL RA PACING THRESHOLD PULSEWIDTH: 0.5 ms
MDC IDC MSMT LEADCHNL RA SENSING INTR AMPL: 3 mV
MDC IDC MSMT LEADCHNL RV PACING THRESHOLD AMPLITUDE: 0.75 V
MDC IDC PG SERIAL: 8943792
MDC IDC SESS DTM: 20181008170621
MDC IDC SET LEADCHNL RV PACING PULSEWIDTH: 0.5 ms
MDC IDC STAT BRADY RV PERCENT PACED: 99.98 %
Pulse Gen Model: 2272

## 2017-01-09 NOTE — Progress Notes (Signed)
Wound check appointment. Steri-strips removed, small dot of bright red blood noted at R lateral incision corner where scab dislodged, bandaid applied. Wound without redness or edema. Incision edges approximated and otherwise well healed. Patient aware to monitor for any signs/symptoms of infection. Normal device function. Thresholds, sensing, and impedances consistent with implant measurements. Device programmed at chronic outputs. Histogram distribution appropriate for patient and level of activity. No mode switches or high ventricular rates noted. Patient educated about wound care, arm mobility, lifting restrictions. ROV with JA on 03/22/17.

## 2017-01-12 NOTE — Progress Notes (Signed)
HPI  Patient presents for followup of coronary disease. Since I last saw her she had her pacemaker generator replaced.  She has done well with this.  The patient denies any new symptoms such as chest discomfort, neck or arm discomfort. There has been no new shortness of breath, PND or orthopnea. There have been no reported palpitations, presyncope or syncope.  She has joined a gym and is working out routinely now.  She might need to have right shoulder surgery.    Allergies  Allergen Reactions  . Hydrocodone Shortness Of Breath and Other (See Comments)    SOB, passed out feeling, nausea   . Codeine Nausea And Vomiting and Other (See Comments)    Also passed out  . Cymbalta [Duloxetine Hcl] Nausea And Vomiting and Other (See Comments)    Also passed out  . Lyrica [Pregabalin] Nausea And Vomiting    Current Outpatient Prescriptions  Medication Sig Dispense Refill  . aspirin EC 81 MG tablet Take 81 mg by mouth daily.    Marland Kitchen atorvastatin (LIPITOR) 20 MG tablet Take 20 mg by mouth at bedtime.     . beta carotene w/minerals (OCUVITE) tablet Take 1 tablet by mouth 2 (two) times daily.    . ergocalciferol (VITAMIN D2) 50000 UNITS capsule Take 50,000 Units by mouth once a week. Sunday    . Multiple Vitamin (MULTIVITAMIN WITH MINERALS) TABS tablet Take 1 tablet by mouth daily.    . Probiotic Product (ALIGN PO) Take 1 tablet by mouth daily.    . traMADol (ULTRAM) 50 MG tablet Take 50 mg by mouth 3 (three) times daily as needed (pain).      No current facility-administered medications for this visit.     Past Medical History:  Diagnosis Date  . Arthritis    R shoulder, both knees & low back- degenerative   . Asthma    as a child  . Complete heart block ([place])    s/p PPM  . Complication of anesthesia 2011   told that it as hard to pass tube when they intubated for pacemaker insertion   . Coronary artery disease    s/p PCI  . Family history of adverse reaction to anesthesia    father  hard to put to sleep but he also used a lot alcohol    . Giant cell arteritis (HCC)    polymyalgia rheumatica  . H/O: hysterectomy   . History of hiatal hernia   . Hyperlipidemia   . Hypertension    not treated with med.   . Irritable bowel syndrome   . Polymyalgia rheumatica ([place])   . Presence of permanent cardiac pacemaker   . S/P breast biopsy    secondary to nipple discharge    Past Surgical History:  Procedure Laterality Date  . ABDOMINAL HYSTERECTOMY    . APPENDECTOMY    . BREAST BIOPSY     secondary to nipple discharge  . CARDIAC CATHETERIZATION  2001   x1stent - Alfred Little   . CESAREAN SECTION    . CHOLECYSTECTOMY    . EYE SURGERY     cataracts removed & IOL- both eyes   . INSERT / REPLACE / REMOVE PACEMAKER    . KNEE ARTHROSCOPY Left   . LUMBAR LAMINECTOMY/DECOMPRESSION MICRODISCECTOMY Left 06/08/2015   Procedure: Left Lumbar Two-Three Microdiskectomy;  Surgeon: Kary Kos, MD;  Location: [place] [facility];  Service: Neurosurgery;  Laterality: Left;  Left L2-3 Microdiskectomy  . PACEMAKER INSERTION  11/15/2007  St. Jude Pacemaker Zephyr XL implanted in [place]  . pilondial cyst resection    . PPM GENERATOR CHANGEOUT N/A 12/27/2016   Procedure: PPM GENERATOR CHANGEOUT;  Surgeon: Thompson Grayer, MD;  Location: [place] [facility] LAB;  Service: Cardiovascular;  Laterality: N/A;  . TEMPORAL ARTERY BIOPSY / LIGATION    . TONSILLECTOMY      ROS:  As stated in the HPI and negative for all other systems.  PHYSICAL EXAM BP 130/60   Pulse 78   Ht 5' (1.524 m)   Wt 131 lb 3.2 oz (59.5 kg)   BMI 25.62 kg/m  GENERAL:  Well appearing NECK:  No jugular venous distention, waveform within normal limits, carotid upstroke brisk and symmetric, no bruits, no thyromegaly LUNGS:  Clear to auscultation bilaterally BACK:  No CVA tenderness CHEST:  Well healed pacemaker pocket.   HEART:  PMI not displaced or sustained,S1 and S2 within normal limits, no S3, no S4, no clicks, no rubs, no  murmurs ABD:  Flat, positive bowel sounds normal in frequency in pitch, no bruits, no rebound, no guarding, no midline pulsatile mass, no hepatomegaly, no splenomegaly EXT:  2 plus pulses throughout, no edema, no cyanosis no clubbing   EKG:  NA   ASSESSMENT AND PLAN   SLEEP WALKING - She has yet to see Dr. Brett Fairy.  She has many things going on and she wants to wait on this.   CAD -  She had a low risk stress perfusion study in Dec 2012.   She has excellent exercise capacity.  No change in therapy or further testing is indicated.   HYPERTENSION -  The blood pressure is at target.  No change in therapy.   PACEMAKER, PERMANENT -  She is up to date with follow up in the pacer clinic.   PALPIT ATIONS - These are not particularly symptomatic.

## 2017-01-13 ENCOUNTER — Ambulatory Visit (INDEPENDENT_AMBULATORY_CARE_PROVIDER_SITE_OTHER): Payer: Medicare Other | Admitting: Cardiology

## 2017-01-13 VITALS — BP 130/60 | HR 78 | Ht 60.0 in | Wt 131.2 lb

## 2017-01-13 DIAGNOSIS — I10 Essential (primary) hypertension: Secondary | ICD-10-CM

## 2017-01-13 DIAGNOSIS — I2510 Atherosclerotic heart disease of native coronary artery without angina pectoris: Secondary | ICD-10-CM | POA: Diagnosis not present

## 2017-01-13 NOTE — Patient Instructions (Addendum)
Your physician wants you to follow-up in: 6 Months You will receive a reminder letter in the mail two months in advance. If you don't receive a letter, please call our office to schedule the follow-up appointment.  

## 2017-01-15 ENCOUNTER — Encounter: Payer: Self-pay | Admitting: Cardiology

## 2017-03-17 ENCOUNTER — Encounter: Payer: Self-pay | Admitting: Internal Medicine

## 2017-03-22 ENCOUNTER — Ambulatory Visit (INDEPENDENT_AMBULATORY_CARE_PROVIDER_SITE_OTHER): Payer: Medicare Other | Admitting: Internal Medicine

## 2017-03-22 ENCOUNTER — Encounter: Payer: Self-pay | Admitting: Internal Medicine

## 2017-03-22 VITALS — BP 142/56 | HR 77 | Ht 60.0 in | Wt 133.6 lb

## 2017-03-22 DIAGNOSIS — I442 Atrioventricular block, complete: Secondary | ICD-10-CM | POA: Diagnosis not present

## 2017-03-22 DIAGNOSIS — I2510 Atherosclerotic heart disease of native coronary artery without angina pectoris: Secondary | ICD-10-CM

## 2017-03-22 DIAGNOSIS — I10 Essential (primary) hypertension: Secondary | ICD-10-CM

## 2017-03-22 DIAGNOSIS — Z950 Presence of cardiac pacemaker: Secondary | ICD-10-CM | POA: Diagnosis not present

## 2017-03-22 NOTE — Patient Instructions (Addendum)
Medication Instructions:  Your physician recommends that you continue on your current medications as directed. Please refer to the Current Medication list given to you today.   Labwork: None ordered   Testing/Procedures: None ordered   Follow-Up: Remote monitoring is used to monitor your Pacemaker from home. This monitoring reduces the number of office visits required to check your device to one time per year. It allows [place] to keep an eye on the functioning of your device to ensure it is working properly. You are scheduled for a device check from home on 06/21/17. You may send your transmission at any time that day. If you have a wireless device, the transmission will be sent automatically. After your physician reviews your transmission, you will receive a postcard with your next transmission date.   Your physician wants you to follow-up in: 12 months with Jamie Marshall, NP You will receive a reminder letter in the mail two months in advance. If you don't receive a letter, please call our office to schedule the follow-up appointment.    Any Other Special Instructions Will Be Listed Below (If Applicable).     If you need a refill on your cardiac medications before your next appointment, please call your pharmacy.

## 2017-03-22 NOTE — Progress Notes (Signed)
PCP: Prince Solian, MD Primary Cardiologist:  Dr Percival Spanish Primary EP:  Dr Candyce Churn is a 83 y.o. female who presents today for routine electrophysiology followup.  Since her recent generator change, the patient reports doing very well.  Today, she denies symptoms of palpitations, chest pain, shortness of breath,  lower extremity edema, dizziness, presyncope, or syncope.  The patient is otherwise without complaint today.   Past Medical History:  Diagnosis Date  . Arthritis    R shoulder, both knees & low back- degenerative   . Asthma    as a child  . Complete heart block ([place])    s/p PPM  . Complication of anesthesia 2011   told that it as hard to pass tube when they intubated for pacemaker insertion   . Coronary artery disease    s/p PCI  . Family history of adverse reaction to anesthesia    father hard to put to sleep but he also used a lot alcohol    . Giant cell arteritis (HCC)    polymyalgia rheumatica  . H/O: hysterectomy   . History of hiatal hernia   . Hyperlipidemia   . Hypertension    not treated with med.   . Irritable bowel syndrome   . Polymyalgia rheumatica ([place])   . Presence of permanent cardiac pacemaker   . S/P breast biopsy    secondary to nipple discharge   Past Surgical History:  Procedure Laterality Date  . ABDOMINAL HYSTERECTOMY    . APPENDECTOMY    . BREAST BIOPSY     secondary to nipple discharge  . CARDIAC CATHETERIZATION  2001   x1stent - Alfred Little   . CESAREAN SECTION    . CHOLECYSTECTOMY    . EYE SURGERY     cataracts removed & IOL- both eyes   . INSERT / REPLACE / REMOVE PACEMAKER    . KNEE ARTHROSCOPY Left   . LUMBAR LAMINECTOMY/DECOMPRESSION MICRODISCECTOMY Left 06/08/2015   Procedure: Left Lumbar Two-Three Microdiskectomy;  Surgeon: Kary Kos, MD;  Location: [place] [facility];  Service: Neurosurgery;  Laterality: Left;  Left L2-3 Microdiskectomy  . PACEMAKER INSERTION  11/15/2007   St. Jude Pacemaker Zephyr XL  implanted in [place]  . pilondial cyst resection    . PPM GENERATOR CHANGEOUT N/A 12/27/2016   Procedure: PPM GENERATOR CHANGEOUT;  Surgeon: Thompson Grayer, MD;  Location: [facility];  Service: Cardiovascular;  Laterality: N/A;  . TEMPORAL ARTERY BIOPSY / LIGATION    . TONSILLECTOMY      ROS- all systems are reviewed and negative except as per HPI above  Current Outpatient Medications  Medication Sig Dispense Refill  . aspirin EC 81 MG tablet Take 81 mg by mouth daily.    Marland Kitchen atorvastatin (LIPITOR) 20 MG tablet Take 20 mg by mouth at bedtime.     . beta carotene w/minerals (OCUVITE) tablet Take 1 tablet by mouth 2 (two) times daily.    . ergocalciferol (VITAMIN D2) 50000 UNITS capsule Take 50,000 Units by mouth once a week. Sunday    . Multiple Vitamin (MULTIVITAMIN WITH MINERALS) TABS tablet Take 1 tablet by mouth daily.    . Probiotic Product (ALIGN PO) Take 1 tablet by mouth daily.    . traMADol (ULTRAM) 50 MG tablet Take 50 mg by mouth 3 (three) times daily as needed (pain).      No current facility-administered medications for this visit.     Physical Exam: Vitals:   03/22/17 1550  BP: (!) 142/56  Pulse: 77  SpO2: 97%  Weight: 133 lb 9.6 oz (60.6 kg)  Height: 5' (1.524 m)    GEN- The patient is well appearing, alert and oriented x 3 today.   Head- normocephalic, atraumatic Eyes-  Sclera clear, conjunctiva pink Ears- hearing intact Oropharynx- clear Lungs- Clear to ausculation bilaterally, normal work of breathing Chest- pacemaker pocket is well healed Heart- Regular rate and rhythm, no murmurs, rubs or gallops, PMI not laterally displaced GI- soft, NT, ND, + BS Extremities- no clubbing, cyanosis, or edema  Pacemaker interrogation- reviewed in detail today,  See PACEART report  EKG today reveals sinus with V pacing   Assessment and Plan:  1. Symptomatic complete heart block Normal pacemaker function Atrial lead noise is noted but not reproducible today.  No  ventricular noise is noted.  Atrial lead sensitivity reduced from 0.5 to 1 mV. See Pace Art report No changes today  2. HTN Stable No change required today  3. CAD No ischemic symptoms No changes  [facility] Return to see EP NP in a year  Thompson Grayer MD, [facility] 03/22/2017 4:16 PM

## 2017-03-30 LAB — CUP PACEART INCLINIC DEVICE CHECK
Battery Remaining Longevity: 106 mo
Battery Voltage: 3.04 V
Brady Statistic RA Percent Paced: 29 %
Implantable Lead Implant Date: 20090813
Implantable Lead Location: 753859
Lead Channel Impedance Value: 425 Ohm
Lead Channel Impedance Value: 587.5 Ohm
Lead Channel Pacing Threshold Pulse Width: 0.5 ms
Lead Channel Sensing Intrinsic Amplitude: 3.1 mV
Lead Channel Setting Pacing Amplitude: 2 V
Lead Channel Setting Pacing Amplitude: 2.5 V
Lead Channel Setting Pacing Pulse Width: 0.5 ms
Lead Channel Setting Sensing Sensitivity: 4 mV
MDC IDC LEAD IMPLANT DT: 20090813
MDC IDC LEAD LOCATION: 753860
MDC IDC MSMT LEADCHNL RA PACING THRESHOLD AMPLITUDE: 0.5 V
MDC IDC MSMT LEADCHNL RA PACING THRESHOLD PULSEWIDTH: 0.5 ms
MDC IDC MSMT LEADCHNL RV PACING THRESHOLD AMPLITUDE: 0.75 V
MDC IDC PG IMPLANT DT: 20180925
MDC IDC PG SERIAL: 8943792
MDC IDC SESS DTM: 20181219220636
MDC IDC STAT BRADY RV PERCENT PACED: 99.99 %

## 2017-04-05 ENCOUNTER — Telehealth: Payer: Self-pay | Admitting: *Deleted

## 2017-04-05 NOTE — Telephone Encounter (Signed)
Follow up    Patient received call from [facility] and is returning call. Please call

## 2017-04-05 NOTE — Telephone Encounter (Signed)
Attempted to return patient's call at both home and cell numbers.  LM on cell VM requesting call back to the [facility].

## 2017-04-05 NOTE — Telephone Encounter (Signed)
LMOM requesting call back to the [facility], gave direct number. 

## 2017-04-05 NOTE — Telephone Encounter (Signed)
Patient called stating she was currently out of town but would be available next week. Patient scheduled for Dc appointment 04/10/17 at 2pm.

## 2017-04-10 ENCOUNTER — Ambulatory Visit (INDEPENDENT_AMBULATORY_CARE_PROVIDER_SITE_OTHER): Payer: Self-pay | Admitting: *Deleted

## 2017-04-10 DIAGNOSIS — I442 Atrioventricular block, complete: Secondary | ICD-10-CM

## 2017-04-10 LAB — CUP PACEART INCLINIC DEVICE CHECK
Brady Statistic RV Percent Paced: 99.99 %
Date Time Interrogation Session: 20190107141623
Implantable Lead Location: 753859
Implantable Lead Location: 753860
Lead Channel Impedance Value: 575 Ohm
Lead Channel Sensing Intrinsic Amplitude: 3 mV
Lead Channel Setting Pacing Amplitude: 2.5 V
Lead Channel Setting Pacing Pulse Width: 0.5 ms
MDC IDC LEAD IMPLANT DT: 20090813
MDC IDC LEAD IMPLANT DT: 20090813
MDC IDC MSMT BATTERY REMAINING LONGEVITY: 108 mo
MDC IDC MSMT BATTERY VOLTAGE: 3.04 V
MDC IDC MSMT LEADCHNL RA IMPEDANCE VALUE: 450 Ohm
MDC IDC PG IMPLANT DT: 20180925
MDC IDC SET LEADCHNL RA PACING AMPLITUDE: 2 V
MDC IDC SET LEADCHNL RV SENSING SENSITIVITY: 4 mV
MDC IDC STAT BRADY RA PERCENT PACED: 22 %
Pulse Gen Model: 2272
Pulse Gen Serial Number: 8943792

## 2017-04-10 NOTE — Progress Notes (Signed)
Programming changes made per [facility] notes. Atrial pulse width programmed back to 0.74ms from 1.19ms and atrial sensitivity programed from 0.35mV to 1.7mV

## 2017-06-21 ENCOUNTER — Ambulatory Visit (INDEPENDENT_AMBULATORY_CARE_PROVIDER_SITE_OTHER): Payer: Medicare Other | Admitting: *Deleted

## 2017-06-21 DIAGNOSIS — I442 Atrioventricular block, complete: Secondary | ICD-10-CM

## 2017-06-21 NOTE — Progress Notes (Signed)
Remote pacemaker transmission.   

## 2017-06-22 ENCOUNTER — Encounter: Payer: Self-pay | Admitting: Cardiology

## 2017-06-23 LAB — CUP PACEART REMOTE DEVICE CHECK
Brady Statistic AP VP Percent: 22 %
Brady Statistic AS VP Percent: 78 %
Brady Statistic AS VS Percent: 1 %
Brady Statistic RA Percent Paced: 21 %
Brady Statistic RV Percent Paced: 99 %
Date Time Interrogation Session: 20190320063522
Implantable Lead Implant Date: 20090813
Implantable Lead Location: 753859
Implantable Lead Location: 753860
Lead Channel Impedance Value: 580 Ohm
Lead Channel Pacing Threshold Pulse Width: 0.5 ms
Lead Channel Pacing Threshold Pulse Width: 0.5 ms
Lead Channel Sensing Intrinsic Amplitude: 9.3 mV
Lead Channel Setting Pacing Amplitude: 2 V
Lead Channel Setting Pacing Pulse Width: 0.5 ms
MDC IDC LEAD IMPLANT DT: 20090813
MDC IDC MSMT BATTERY REMAINING LONGEVITY: 107 mo
MDC IDC MSMT BATTERY REMAINING PERCENTAGE: 95.5 %
MDC IDC MSMT BATTERY VOLTAGE: 3.01 V
MDC IDC MSMT LEADCHNL RA IMPEDANCE VALUE: 490 Ohm
MDC IDC MSMT LEADCHNL RA PACING THRESHOLD AMPLITUDE: 0.5 V
MDC IDC MSMT LEADCHNL RA SENSING INTR AMPL: 3.1 mV
MDC IDC MSMT LEADCHNL RV PACING THRESHOLD AMPLITUDE: 0.75 V
MDC IDC PG IMPLANT DT: 20180925
MDC IDC SET LEADCHNL RV PACING AMPLITUDE: 2.5 V
MDC IDC SET LEADCHNL RV SENSING SENSITIVITY: 4 mV
MDC IDC STAT BRADY AP VS PERCENT: 1 %
Pulse Gen Model: 2272
Pulse Gen Serial Number: 8943792

## 2017-07-03 DIAGNOSIS — E042 Nontoxic multinodular goiter: Secondary | ICD-10-CM | POA: Diagnosis not present

## 2017-07-03 DIAGNOSIS — E7849 Other hyperlipidemia: Secondary | ICD-10-CM | POA: Diagnosis not present

## 2017-07-03 DIAGNOSIS — M810 Age-related osteoporosis without current pathological fracture: Secondary | ICD-10-CM | POA: Diagnosis not present

## 2017-07-03 DIAGNOSIS — R7302 Impaired glucose tolerance (oral): Secondary | ICD-10-CM | POA: Diagnosis not present

## 2017-07-03 DIAGNOSIS — R82998 Other abnormal findings in urine: Secondary | ICD-10-CM | POA: Diagnosis not present

## 2017-07-10 ENCOUNTER — Emergency Department (HOSPITAL_COMMUNITY): Payer: Medicare Other

## 2017-07-10 ENCOUNTER — Encounter (HOSPITAL_COMMUNITY): Payer: Self-pay | Admitting: Neurology

## 2017-07-10 ENCOUNTER — Inpatient Hospital Stay (HOSPITAL_COMMUNITY)
Admission: EM | Admit: 2017-07-10 | Discharge: 2017-07-13 | DRG: 835 | Disposition: A | Discharge status: Other Acute Inpatient Hospital | Payer: Medicare Other | Attending: Family Medicine | Admitting: Family Medicine

## 2017-07-10 DIAGNOSIS — M810 Age-related osteoporosis without current pathological fracture: Secondary | ICD-10-CM | POA: Diagnosis not present

## 2017-07-10 DIAGNOSIS — D61818 Other pancytopenia: Secondary | ICD-10-CM | POA: Diagnosis present

## 2017-07-10 DIAGNOSIS — Z955 Presence of coronary angioplasty implant and graft: Secondary | ICD-10-CM

## 2017-07-10 DIAGNOSIS — Z8249 Family history of ischemic heart disease and other diseases of the circulatory system: Secondary | ICD-10-CM

## 2017-07-10 DIAGNOSIS — I442 Atrioventricular block, complete: Secondary | ICD-10-CM | POA: Diagnosis not present

## 2017-07-10 DIAGNOSIS — Z0000 Encounter for general adult medical examination without abnormal findings: Secondary | ICD-10-CM | POA: Diagnosis not present

## 2017-07-10 DIAGNOSIS — R7302 Impaired glucose tolerance (oral): Secondary | ICD-10-CM | POA: Diagnosis not present

## 2017-07-10 DIAGNOSIS — D539 Nutritional anemia, unspecified: Secondary | ICD-10-CM | POA: Diagnosis present

## 2017-07-10 DIAGNOSIS — I10 Essential (primary) hypertension: Secondary | ICD-10-CM | POA: Diagnosis not present

## 2017-07-10 DIAGNOSIS — C9200 Acute myeloblastic leukemia, not having achieved remission: Principal | ICD-10-CM | POA: Diagnosis present

## 2017-07-10 DIAGNOSIS — Z961 Presence of intraocular lens: Secondary | ICD-10-CM | POA: Diagnosis present

## 2017-07-10 DIAGNOSIS — R51 Headache: Secondary | ICD-10-CM | POA: Diagnosis not present

## 2017-07-10 DIAGNOSIS — Z6824 Body mass index (BMI) 24.0-24.9, adult: Secondary | ICD-10-CM | POA: Diagnosis not present

## 2017-07-10 DIAGNOSIS — Z9861 Coronary angioplasty status: Secondary | ICD-10-CM | POA: Diagnosis not present

## 2017-07-10 DIAGNOSIS — I2510 Atherosclerotic heart disease of native coronary artery without angina pectoris: Secondary | ICD-10-CM | POA: Diagnosis not present

## 2017-07-10 DIAGNOSIS — K449 Diaphragmatic hernia without obstruction or gangrene: Secondary | ICD-10-CM | POA: Diagnosis not present

## 2017-07-10 DIAGNOSIS — T451X5A Adverse effect of antineoplastic and immunosuppressive drugs, initial encounter: Secondary | ICD-10-CM | POA: Diagnosis present

## 2017-07-10 DIAGNOSIS — Z885 Allergy status to narcotic agent status: Secondary | ICD-10-CM | POA: Diagnosis not present

## 2017-07-10 DIAGNOSIS — M353 Polymyalgia rheumatica: Secondary | ICD-10-CM | POA: Diagnosis not present

## 2017-07-10 DIAGNOSIS — E785 Hyperlipidemia, unspecified: Secondary | ICD-10-CM | POA: Diagnosis present

## 2017-07-10 DIAGNOSIS — Z950 Presence of cardiac pacemaker: Secondary | ICD-10-CM | POA: Diagnosis not present

## 2017-07-10 DIAGNOSIS — Z90710 Acquired absence of both cervix and uterus: Secondary | ICD-10-CM

## 2017-07-10 DIAGNOSIS — D649 Anemia, unspecified: Secondary | ICD-10-CM | POA: Diagnosis not present

## 2017-07-10 DIAGNOSIS — Z888 Allergy status to other drugs, medicaments and biological substances status: Secondary | ICD-10-CM

## 2017-07-10 DIAGNOSIS — E042 Nontoxic multinodular goiter: Secondary | ICD-10-CM | POA: Diagnosis not present

## 2017-07-10 DIAGNOSIS — Z1389 Encounter for screening for other disorder: Secondary | ICD-10-CM | POA: Diagnosis not present

## 2017-07-10 DIAGNOSIS — R5381 Other malaise: Secondary | ICD-10-CM | POA: Diagnosis present

## 2017-07-10 DIAGNOSIS — E7849 Other hyperlipidemia: Secondary | ICD-10-CM | POA: Diagnosis not present

## 2017-07-10 DIAGNOSIS — K589 Irritable bowel syndrome without diarrhea: Secondary | ICD-10-CM | POA: Diagnosis not present

## 2017-07-10 DIAGNOSIS — R791 Abnormal coagulation profile: Secondary | ICD-10-CM | POA: Diagnosis present

## 2017-07-10 DIAGNOSIS — M316 Other giant cell arteritis: Secondary | ICD-10-CM | POA: Diagnosis not present

## 2017-07-10 DIAGNOSIS — Z7189 Other specified counseling: Secondary | ICD-10-CM

## 2017-07-10 DIAGNOSIS — D696 Thrombocytopenia, unspecified: Secondary | ICD-10-CM

## 2017-07-10 DIAGNOSIS — R0609 Other forms of dyspnea: Secondary | ICD-10-CM | POA: Diagnosis present

## 2017-07-10 DIAGNOSIS — R079 Chest pain, unspecified: Secondary | ICD-10-CM | POA: Diagnosis not present

## 2017-07-10 DIAGNOSIS — Z9841 Cataract extraction status, right eye: Secondary | ICD-10-CM

## 2017-07-10 DIAGNOSIS — C9202 Acute myeloblastic leukemia, in relapse: Secondary | ICD-10-CM | POA: Diagnosis not present

## 2017-07-10 DIAGNOSIS — D709 Neutropenia, unspecified: Secondary | ICD-10-CM | POA: Diagnosis not present

## 2017-07-10 DIAGNOSIS — M129 Arthropathy, unspecified: Secondary | ICD-10-CM | POA: Diagnosis not present

## 2017-07-10 DIAGNOSIS — D61810 Antineoplastic chemotherapy induced pancytopenia: Secondary | ICD-10-CM

## 2017-07-10 DIAGNOSIS — Z9842 Cataract extraction status, left eye: Secondary | ICD-10-CM | POA: Diagnosis not present

## 2017-07-10 DIAGNOSIS — R0602 Shortness of breath: Secondary | ICD-10-CM | POA: Diagnosis not present

## 2017-07-10 LAB — HEPATIC FUNCTION PANEL
ALBUMIN: 3.7 g/dL (ref 3.5–5.0)
ALT: 26 U/L (ref 14–54)
AST: 22 U/L (ref 15–41)
Alkaline Phosphatase: 42 U/L (ref 38–126)
Bilirubin, Direct: 0.2 mg/dL (ref 0.1–0.5)
Indirect Bilirubin: 0.8 mg/dL (ref 0.3–0.9)
Total Bilirubin: 1 mg/dL (ref 0.3–1.2)
Total Protein: 6.8 g/dL (ref 6.5–8.1)

## 2017-07-10 LAB — BASIC METABOLIC PANEL
Anion gap: 12 (ref 5–15)
BUN: 18 mg/dL (ref 6–20)
CHLORIDE: 104 mmol/L (ref 101–111)
CO2: 23 mmol/L (ref 22–32)
CREATININE: 0.83 mg/dL (ref 0.44–1.00)
Calcium: 9.2 mg/dL (ref 8.9–10.3)
GFR calc Af Amer: >60 mL/min (ref >60)
Glucose, Bld: 107 mg/dL — ABNORMAL HIGH (ref 65–99)
Potassium: 3.9 mmol/L (ref 3.5–5.1)
SODIUM: 139 mmol/L (ref 135–145)

## 2017-07-10 LAB — CBC
HCT: 26.2 % — ABNORMAL LOW (ref 36.0–46.0)
Hemoglobin: 8.7 g/dL — ABNORMAL LOW (ref 12.0–15.0)
MCH: 34.8 pg — ABNORMAL HIGH (ref 26.0–34.0)
MCHC: 33.2 g/dL (ref 30.0–36.0)
MCV: 104.8 fL — AB (ref 78.0–100.0)
Platelets: 29 10*3/uL — CL (ref 150–400)
RBC: 2.5 MIL/uL — ABNORMAL LOW (ref 3.87–5.11)
RDW: 14.5 % (ref 11.5–15.5)
WBC: 6.7 10*3/uL (ref 4.0–10.5)

## 2017-07-10 LAB — DIFFERENTIAL
BASOS ABS: 0.1 10*3/uL (ref 0.0–0.1)
BLASTS: 0 %
Band Neutrophils: 0 %
Basophils Relative: 2 %
EOS ABS: 0 10*3/uL (ref 0.0–0.7)
Eosinophils Relative: 0 %
LYMPHS ABS: 4.9 10*3/uL — AB (ref 0.7–4.0)
Lymphocytes Relative: 73 %
MONO ABS: 0.8 10*3/uL (ref 0.1–1.0)
MYELOCYTES: 0 %
Metamyelocytes Relative: 0 %
Monocytes Relative: 12 %
Neutro Abs: 0.1 10*3/uL — ABNORMAL LOW (ref 1.7–7.7)
Neutrophils Relative %: 2 %
Other: 11 %
PROMYELOCYTES RELATIVE: 0 %
nRBC: 0 /100 WBC

## 2017-07-10 LAB — I-STAT TROPONIN, ED: Troponin i, poc: 0.01 ng/mL (ref 0.00–0.08)

## 2017-07-10 LAB — ABO/RH: ABO/RH(D): A POS

## 2017-07-10 LAB — SAVE SMEAR

## 2017-07-10 LAB — PROTIME-INR
INR: 1.13
PROTHROMBIN TIME: 14.4 s (ref 11.4–15.2)

## 2017-07-10 LAB — DIC (DISSEMINATED INTRAVASCULAR COAGULATION) PANEL
APTT: 37 s — AB (ref 24–36)
D DIMER QUANT: 0.75 ug{FEU}/mL — AB (ref 0.00–0.50)
INR: 1.26
PLATELETS: 26 10*3/uL — AB (ref 150–400)

## 2017-07-10 LAB — LACTATE DEHYDROGENASE: LDH: 235 U/L — ABNORMAL HIGH (ref 98–192)

## 2017-07-10 LAB — DIC (DISSEMINATED INTRAVASCULAR COAGULATION)PANEL
Fibrinogen: 359 mg/dL (ref 210–475)
Prothrombin Time: 15.7 seconds — ABNORMAL HIGH (ref 11.4–15.2)
Smear Review: NONE SEEN

## 2017-07-10 LAB — RETICULOCYTES
RBC.: 2.45 MIL/uL — AB (ref 3.87–5.11)
RETIC COUNT ABSOLUTE: 27 10*3/uL (ref 19.0–186.0)
RETIC CT PCT: 1.1 % (ref 0.4–3.1)

## 2017-07-10 LAB — IRON AND TIBC
Iron: 108 ug/dL (ref 28–170)
Saturation Ratios: 48 % — ABNORMAL HIGH (ref 10.4–31.8)
TIBC: 225 ug/dL — AB (ref 250–450)
UIBC: 117 ug/dL

## 2017-07-10 LAB — VITAMIN B12: Vitamin B-12: 1083 pg/mL — ABNORMAL HIGH (ref 180–914)

## 2017-07-10 MED ORDER — ACETAMINOPHEN 650 MG RE SUPP: 650.0000 mg | Freq: Four times a day (QID) | RECTAL | Status: DC | PRN

## 2017-07-10 MED ORDER — ATORVASTATIN CALCIUM 20 MG PO TABS
20.0000 mg | ORAL_TABLET | Freq: Every day | ORAL | Status: DC
  Administered 2017-07-11 – 2017-07-12 (×2): 20 mg via ORAL
  Filled 2017-07-10: qty 1

## 2017-07-10 MED ORDER — ACETAMINOPHEN 325 MG PO TABS: 650.0000 mg | ORAL_TABLET | Freq: Four times a day (QID) | ORAL | Status: DC | PRN

## 2017-07-10 MED ORDER — SODIUM CHLORIDE 0.9% FLUSH
3.0000 mL | Freq: Two times a day (BID) | INTRAVENOUS | Status: DC
  Administered 2017-07-11 – 2017-07-12 (×2): 3 mL via INTRAVENOUS

## 2017-07-10 MED ORDER — POTASSIUM CHLORIDE IN NACL 20-0.9 MEQ/L-% IV SOLN
INTRAVENOUS | Status: AC
  Administered 2017-07-10: 22:00:00 via INTRAVENOUS
  Filled 2017-07-10 (×2): qty 1000

## 2017-07-10 MED ORDER — ONDANSETRON HCL 4 MG/2ML IJ SOLN: 4.0000 mg | Freq: Four times a day (QID) | INTRAMUSCULAR | Status: DC | PRN

## 2017-07-10 MED ORDER — TRAMADOL HCL 50 MG PO TABS: 50.0000 mg | ORAL_TABLET | Freq: Three times a day (TID) | ORAL | Status: DC | PRN

## 2017-07-10 MED ORDER — ONDANSETRON HCL 4 MG PO TABS: 4.0000 mg | ORAL_TABLET | Freq: Four times a day (QID) | ORAL | Status: DC | PRN

## 2017-07-10 NOTE — ED Provider Notes (Signed)
[facility] EMERGENCY DEPARTMENT Provider Note   CSN: [id number] Arrival date & time: 07/10/17  1052     History   Chief Complaint Chief Complaint  Patient presents with  . Shortness of Breath    HPI Jamie Reese is a 84 y.o. female.  The history is provided by the patient. No language interpreter was used.  Shortness of Breath    Jamie Reese is a 84 y.o. female who presents to the Emergency Department complaining of sob. She presents to the emergency department for evaluation of shortness of breath and thrombocytopenia. She had an upper respiratory infection about three weeks ago with sore throat and headache. She was seen by her PCP and treated with medications. She states that her sore throat is resolved but she has persistent unwell feeling in her head. Over the last week she endorses associated shortness of breath with dyspnea on exertion. She does have occasional chest tightness. She saw her PCP on April 1 and had outpatient labs performed at that time. On that day she had a white blood cell count of 4.6, hemoglobin of 10.3 and platelet count of 46 she saw her doctor again today and had repeat labs in the office performed with noted a white blood cell count of seven, hemoglobin of 9.1 and platelet count of 27,000. She was referred to the emergency department for further evaluation. She did cut her leg earlier in the week and did have a lot of bleeding at that time. Otherwise no bleeding. She denies any block or bloody stools. No skin rash. Past Medical History:  Diagnosis Date  . Arthritis    R shoulder, both knees & low back- degenerative   . Asthma    as a child  . Complete heart block ([place])    s/p PPM  . Complication of anesthesia 2011   told that it as hard to pass tube when they intubated for pacemaker insertion   . Coronary artery disease    s/p PCI  . Family history of adverse reaction to anesthesia    father hard to put to sleep but he also  used a lot alcohol    . Giant cell arteritis (HCC)    polymyalgia rheumatica  . H/O: hysterectomy   . History of hiatal hernia   . Hyperlipidemia   . Hypertension    not treated with med.   . Irritable bowel syndrome   . Polymyalgia rheumatica ([place])   . Presence of permanent cardiac pacemaker   . S/P breast biopsy    secondary to nipple discharge    Patient Active Problem List   Diagnosis Date Noted  . HNP (herniated nucleus pulposus), lumbar 06/08/2015  . Complete heart block ([place]) 04/24/2013  . Corneal edema, secondary 07/11/2011  . H/O cataract extraction 07/11/2011  . Chest pain 03/03/2011  . PACEMAKER, PERMANENT 12/30/2008  . HYPERLIPIDEMIA 12/29/2008  . Essential hypertension 12/29/2008  . Coronary atherosclerosis 12/29/2008  . SYNCOPE 12/29/2008    Past Surgical History:  Procedure Laterality Date  . ABDOMINAL HYSTERECTOMY    . APPENDECTOMY    . BREAST BIOPSY     secondary to nipple discharge  . CARDIAC CATHETERIZATION  2001   x1stent - Alfred Little   . CESAREAN SECTION    . CHOLECYSTECTOMY    . EYE SURGERY     cataracts removed & IOL- both eyes   . INSERT / REPLACE / REMOVE PACEMAKER    . KNEE ARTHROSCOPY Left   .  LUMBAR LAMINECTOMY/DECOMPRESSION MICRODISCECTOMY Left 06/08/2015   Procedure: Left Lumbar Two-Three Microdiskectomy;  Surgeon: Kary Kos, MD;  Location: [place] [facility];  Service: Neurosurgery;  Laterality: Left;  Left L2-3 Microdiskectomy  . PACEMAKER INSERTION  11/15/2007   St. Jude Pacemaker Zephyr XL implanted in [place]  . pilondial cyst resection    . PPM GENERATOR CHANGEOUT N/A 12/27/2016   SJM Assurity MRI generator change by Dr Rayann Heman  . TEMPORAL ARTERY BIOPSY / LIGATION    . TONSILLECTOMY       OB History   None      Home Medications    Prior to Admission medications   Medication Sig Start Date End Date Taking? Authorizing Provider  aspirin EC 81 MG tablet Take 81 mg by mouth daily.    [provider]  atorvastatin (LIPITOR)  20 MG tablet Take 20 mg by mouth at bedtime.     [provider]  beta carotene w/minerals (OCUVITE) tablet Take 1 tablet by mouth 2 (two) times daily.    [provider]  ergocalciferol (VITAMIN D2) 50000 UNITS capsule Take 50,000 Units by mouth once a week. Sunday    [provider]  Multiple Vitamin (MULTIVITAMIN WITH MINERALS) TABS tablet Take 1 tablet by mouth daily.    [provider]  Probiotic Product (ALIGN PO) Take 1 tablet by mouth daily.    [provider]  traMADol (ULTRAM) 50 MG tablet Take 50 mg by mouth 3 (three) times daily as needed (pain).     [provider]    Family History Family History  Problem Relation Age of Onset  . Heart attack Father 47  . Heart Problems Mother        PPM  . Cancer Unknown     Social History Social History   Tobacco Use  . Smoking status: Never Smoker  . Smokeless tobacco: Never Used  Substance Use Topics  . Alcohol use: No  . Drug use: No     Allergies   Hydrocodone; Codeine; Cymbalta [duloxetine hcl]; and Lyrica [pregabalin]   Review of Systems Review of Systems  Respiratory: Positive for shortness of breath.   All other systems reviewed and are negative.    Physical Exam Updated Vital Signs BP (!) 141/84   Pulse 81   Temp 98.1 F (36.7 C)   Resp 14   Ht 5' (1.524 m)   Wt 59.4 kg (131 lb)   SpO2 100%   BMI 25.58 kg/m   Physical Exam  Constitutional: She is oriented to person, place, and time. She appears well-developed and well-nourished.  HENT:  Head: Normocephalic and atraumatic.  Cardiovascular: Normal rate and regular rhythm.  No murmur heard. Pulmonary/Chest: Effort normal. No respiratory distress.  Fine crackles and bilateral bases  Abdominal: Soft. There is no tenderness. There is no rebound and no guarding.  Musculoskeletal: She exhibits no edema or tenderness.  Neurological: She is alert and oriented to person, place, and time.  Skin: Skin is  warm and dry.  Psychiatric: She has a normal mood and affect. Her behavior is normal.  Nursing note and vitals reviewed.    ED Treatments / Results  Labs (all labs ordered are listed, but only abnormal results are displayed) Labs Reviewed  BASIC METABOLIC PANEL - Abnormal; Notable for the following components:      Result Value   Glucose, Bld 107 (*)    All other components within normal limits  CBC - Abnormal; Notable for the following components:  RBC 2.50 (*)    Hemoglobin 8.7 (*)    HCT 26.2 (*)    MCV 104.8 (*)    MCH 34.8 (*)    Platelets 29 (*)    All other components within normal limits  DIFFERENTIAL  HEPATIC FUNCTION PANEL  PROTIME-INR  I-STAT TROPONIN, ED  TYPE AND SCREEN    EKG EKG Interpretation  Date/Time:  Monday July 10 2017 11:03:17 EDT Ventricular Rate:  83 PR Interval:  242 QRS Duration: 148 QT Interval:  416 QTC Calculation: 488 R Axis:   -71 Text Interpretation:  Atrial-sensed ventricular-paced rhythm with prolonged AV conduction Abnormal ECG No significant change since last tracing Confirmed by Isla Pence [id number]) on 07/10/2017 3:39:57 PM   Radiology Dg Chest 2 View  Result Date: 07/10/2017 CLINICAL DATA:  Shortness of breath beginning several weeks ago. Low back pain. EXAM: CHEST - 2 VIEW COMPARISON:  None. FINDINGS: Heart size is normal. Dual lead pacemaker is in place. The leads are positioned in the right ventricle and right atrium. The lungs are clear. There is no edema or effusion. Surgical clips are present over the right upper quadrant. IMPRESSION: No active cardiopulmonary disease. Electronically Signed   By: San Morelle M.D.   On: 07/10/2017 12:27    Procedures Procedures (including critical care time)  Medications Ordered in ED Medications - No data to display   Initial Impression / Assessment and Plan / ED Course  I have reviewed the triage vital signs and the nursing notes.  Pertinent labs & imaging results that  were available during my care of the patient were reviewed by me and considered in my medical decision making (see chart for details).    Patient here for evaluation of progressive shortness of breath and thrombocytopenia. She is non-toxic appearing on evaluation. Discussed findings with Dr. Irene Limbo with hematology oncology. He recommends admission to medicine service for possible bone marrow biopsy in the morning. Patient and husband has been updated of findings of studies and recommendation for admission and they are in agreement with plan.  Final Clinical Impressions(s) / ED Diagnoses   Final diagnoses:  None    ED Discharge Orders    None       Quintella Reichert, MD 07/10/17 2213

## 2017-07-10 NOTE — ED Triage Notes (Signed)
Today she was having a physical, reported worsening SOB, denies CP. Also platelets were 27 (have been low), HGB 9.1. Denies any frank blood loss. Pt is a x 4. Any exertion makes her SOB, even talking. She has been this way after a virus several weeks ago.

## 2017-07-10 NOTE — H&P (Signed)
History and Physical    Jamie Reese NMM:[id number] DOB: 1933-04-21 DOA: 07/10/2017  PCP: Prince Solian, MD   Patient coming from: Home  Chief Complaint: Malaise, DOE, abnormal CBC   HPI: Jamie Reese is a 84 y.o. female with medical history significant for complete heart block status post pacer placement, coronary artery disease with stent, and arthritis using tramadol, now presenting to the emergency department with about 3 weeks of general malaise, exertional dyspnea, and abnormal CBC.  Patient reports that she noted the insidious development of exertional dyspnea and nonspecific malaise approximately 3 weeks ago, saw her PCP at that time, was suspected to have a viral URI, but reports that her exertional dyspnea and malaise have persisted.  She reports seeing her PCP for a annual physical, had blood work obtained as part of this, was noted to have a new anemia and marked thrombocytopenia, and was directed to the ED for further evaluation.  She denies any rash, denies melena, denies hematochezia, denies epistaxis or bleeding from her gums, and denies any easy bruising or bleeding.  ED Course: Upon arrival to the ED, patient is found to be afebrile, saturating well on room air, and with vitals otherwise normal.  EKG features a paced rhythm, chest x-ray is negative for acute cardiopulmonary disease, noncontrast head CT is negative for acute intracranial abnormality, and chemistry panel is unremarkable.  CBC is notable for microcytic anemia with hemoglobin of 8.7 and MCV 104.8, as well as thrombocytopenia with platelets 29,000.  All CBC indices were normal in September 2018.  Hematology/oncology was consulted by the ED physician and recommended medical admission and IR consultation for bone marrow biopsy.  Patient remains hemodynamically stable, in no apparent respiratory distress, and will be admitted to the telemetry unit for ongoing evaluation and management of new anemia and  thrombocytopenia.  Review of Systems:  All other systems reviewed and apart from HPI, are negative.  Past Medical History:  Diagnosis Date  . Arthritis    R shoulder, both knees & low back- degenerative   . Asthma    as a child  . Complete heart block ([place])    s/p PPM  . Complication of anesthesia 2011   told that it as hard to pass tube when they intubated for pacemaker insertion   . Coronary artery disease    s/p PCI  . Family history of adverse reaction to anesthesia    father hard to put to sleep but he also used a lot alcohol    . Giant cell arteritis (HCC)    polymyalgia rheumatica  . H/O: hysterectomy   . History of hiatal hernia   . Hyperlipidemia   . Hypertension    not treated with med.   . Irritable bowel syndrome   . Polymyalgia rheumatica ([place])   . Presence of permanent cardiac pacemaker   . S/P breast biopsy    secondary to nipple discharge    Past Surgical History:  Procedure Laterality Date  . ABDOMINAL HYSTERECTOMY    . APPENDECTOMY    . BREAST BIOPSY     secondary to nipple discharge  . CARDIAC CATHETERIZATION  2001   x1stent - Alfred Little   . CESAREAN SECTION    . CHOLECYSTECTOMY    . EYE SURGERY     cataracts removed & IOL- both eyes   . INSERT / REPLACE / REMOVE PACEMAKER    . KNEE ARTHROSCOPY Left   . LUMBAR LAMINECTOMY/DECOMPRESSION MICRODISCECTOMY Left 06/08/2015   Procedure: Left  Lumbar Two-Three Microdiskectomy;  Surgeon: Kary Kos, MD;  Location: [place] [facility];  Service: Neurosurgery;  Laterality: Left;  Left L2-3 Microdiskectomy  . PACEMAKER INSERTION  11/15/2007   St. Jude Pacemaker Zephyr XL implanted in [place]  . pilondial cyst resection    . PPM GENERATOR CHANGEOUT N/A 12/27/2016   SJM Assurity MRI generator change by Dr Rayann Heman  . TEMPORAL ARTERY BIOPSY / LIGATION    . TONSILLECTOMY       reports that she has never smoked. She has never used smokeless tobacco. She reports that she does not drink alcohol or use drugs.  Allergies   Allergen Reactions  . Hydrocodone Shortness Of Breath and Other (See Comments)    SOB, passed out feeling, nausea   . Codeine Nausea And Vomiting and Other (See Comments)    Also passed out  . Cymbalta [Duloxetine Hcl] Nausea And Vomiting and Other (See Comments)    Also passed out  . Lyrica [Pregabalin] Nausea And Vomiting    Family History  Problem Relation Age of Onset  . Heart attack Father 79  . Heart Problems Mother        PPM  . Cancer Unknown      Prior to Admission medications   Medication Sig Start Date End Date Taking? Authorizing Provider  aspirin EC 81 MG tablet Take 81 mg by mouth daily.   Yes [provider]  atorvastatin (LIPITOR) 20 MG tablet Take 20 mg by mouth daily at 6 PM.    Yes [provider]  beta carotene w/minerals (OCUVITE) tablet Take 1 tablet by mouth 2 (two) times daily. Also [place] presivision   Yes [provider]  ergocalciferol (VITAMIN D2) 50000 UNITS capsule Take 50,000 Units by mouth once a week. Sunday   Yes [provider]  Multiple Vitamin (MULTIVITAMIN WITH MINERALS) TABS tablet Take 1 tablet by mouth daily.   Yes [provider]  Probiotic Product (ALIGN PO) Take 1 tablet by mouth daily.   Yes [provider]  traMADol (ULTRAM) 50 MG tablet Take 50 mg by mouth 3 (three) times daily as needed (pain).    Yes [provider]    Physical Exam: Vitals:   07/10/17 1700 07/10/17 1730 07/10/17 1930 07/10/17 2000  BP: (!) 143/46 (!) 138/49 (!) 127/45 (!) 115/43  Pulse: 78 79 91 87  Resp: _0 Temp:      TempSrc:      SpO2: 99% 100% 98% 97%  Weight:      Height:          Constitutional: NAD, calm  Eyes: PERTLA, lids and conjunctivae normal ENMT: Mucous membranes are moist. Posterior pharynx clear of any exudate or lesions.   Neck: normal, supple, no masses, no thyromegaly Respiratory: clear to auscultation bilaterally, no wheezing, no crackles. Normal respiratory  effort.   Cardiovascular: S1 & S2 heard, regular rate and rhythm. No significant JVD. Abdomen: No distension, no tenderness, soft. Bowel sounds normal.  Musculoskeletal: no clubbing / cyanosis. No joint deformity upper and lower extremities.   Skin: no significant rashes, lesions, ulcers. Warm, dry, well-perfused. Neurologic: CN 2-12 grossly intact. Sensation intact. Strength 5/5 in all 4 limbs. Gross hearing deficit.  Psychiatric:  Alert and oriented x 3. Calm, cooperative.     Labs on Admission: I have personally reviewed following labs and imaging studies  CBC: Recent Labs  Lab 07/10/17 1130 07/10/17 1848  WBC 6.7  --   NEUTROABS 0.1*  --  HGB 8.7*  --   HCT 26.2*  --   MCV 104.8*  --   PLT 29* 26*   Basic Metabolic Panel: Recent Labs  Lab 07/10/17 1130  NA 139  K 3.9  CL 104  CO2 23  GLUCOSE 107*  BUN 18  CREATININE 0.83  CALCIUM 9.2   GFR: Estimated Creatinine Clearance: 42.2 mL/min (by C-G formula based on SCr of 0.83 mg/dL). Liver Function Tests: Recent Labs  Lab 07/10/17 1130  AST 22  ALT 26  ALKPHOS 42  BILITOT 1.0  PROT 6.8  ALBUMIN 3.7   No results for input(s): LIPASE, AMYLASE in the last 168 hours. No results for input(s): AMMONIA in the last 168 hours. Coagulation Profile: Recent Labs  Lab 07/10/17 1130 07/10/17 1848  INR 1.13 1.26   Cardiac Enzymes: No results for input(s): CKTOTAL, CKMB, CKMBINDEX, TROPONINI in the last 168 hours. BNP (last 3 results) No results for input(s): PROBNP in the last 8760 hours. HbA1C: No results for input(s): HGBA1C in the last 72 hours. CBG: No results for input(s): GLUCAP in the last 168 hours. Lipid Profile: No results for input(s): CHOL, HDL, LDLCALC, TRIG, CHOLHDL, LDLDIRECT in the last 72 hours. Thyroid Function Tests: No results for input(s): TSH, T4TOTAL, FREET4, T3FREE, THYROIDAB in the last 72 hours. Anemia Panel: Recent Labs    07/10/17 1848  RETICCTPCT 1.1   Urine analysis: No  results found for: COLORURINE, APPEARANCEUR, LABSPEC, PHURINE, GLUCOSEU, HGBUR, BILIRUBINUR, KETONESUR, PROTEINUR, UROBILINOGEN, NITRITE, LEUKOCYTESUR Sepsis Labs: _0 (procalcitonin:4,lacticidven:4) )No results found for this or any previous visit (from the past 240 hour(s)).   Radiological Exams on Admission: Dg Chest 2 View  Result Date: 07/10/2017 CLINICAL DATA:  Shortness of breath beginning several weeks ago. Low back pain. EXAM: CHEST - 2 VIEW COMPARISON:  None. FINDINGS: Heart size is normal. Dual lead pacemaker is in place. The leads are positioned in the right ventricle and right atrium. The lungs are clear. There is no edema or effusion. Surgical clips are present over the right upper quadrant. IMPRESSION: No active cardiopulmonary disease. Electronically Signed   By: San Morelle M.D.   On: 07/10/2017 12:27   Ct Head Wo Contrast  Result Date: 07/10/2017 CLINICAL DATA:  84 year old female with headache. Low platelets. Initial encounter. EXAM: CT HEAD WITHOUT CONTRAST TECHNIQUE: Contiguous axial images were obtained from the base of the skull through the vertex without intravenous contrast. COMPARISON:  12/03/2015 CT. FINDINGS: Brain: No intracranial hemorrhage or CT evidence of large acute infarct. Mild chronic microvascular changes. No intracranial mass lesion noted on this unenhanced exam. Vascular: Vascular calcifications. Skull: Negative Sinuses/Orbits: No acute orbital abnormality. Visualized paranasal sinuses are clear. Other: Mastoid air cells and middle ear cavities are clear. IMPRESSION: No acute intracranial abnormality. Chronic microvascular changes. Electronically Signed   By: Genia Del M.D.   On: 07/10/2017 18:33    EKG: Independently reviewed. Paced rhythm.   Assessment/Plan  1. Thrombocytopenia; macrocytic anemia  - Presents at direction of PCP for eval of abnormal CBC  - Reports ~3 wks of general malaise and DOE, but denies bleeding, bruising, or rash   - Found to have macrocytic anemia with Hgb 8.7 and MCV 104.8, as well as thrombocytopenia with platelets 29k  - All CBC indices were normal in September 2018  - Hematology-oncology consulting and much appreciated  - IR consulted for bone marrow biopsy    2. Complete heart block  - Has PPM in place   3. CAD - No anginal complaints  -  Hold ASA, continue Lipitor     DVT prophylaxis: SCD's  Code Status: Full  Family Communication: Discussed with patient Consults called: Hematology-oncology Admission status: Inpatient    Vianne Bulls, MD [facility] Pager [phone]  If 7PM-7AM, please contact night-coverage [email or web address] Password [facility]  07/10/2017, 9:38 PM

## 2017-07-10 NOTE — Consult Note (Signed)
Marland Kitchen    HEMATOLOGY/ONCOLOGY CONSULTATION NOTE  Date of Service: 07/11/2017 Patient Care Team: Prince Solian, MD as PCP - General (Internal Medicine) Prince Solian, MD (Internal Medicine)  CHIEF COMPLAINTS/PURPOSE OF CONSULTATION:  Pancytopenia  HISTORY OF PRESENTING ILLNESS:   Jamie Reese is a wonderful 84 y.o. female who has been referred to [place] by Dr Debbe Odea for evaluation and management of newly diagnosed pancytopenia.  Patient has a history of multiple medical comorbidities including coronary artery disease status post stenting,, complete heart block status post permanent pacemaker placement, degenerative arthritis, polymyalgia rheumatica ?,  Hiatal hernia who presented to the emergency room with about 2-3 weeks of worsening fatigue and generalized malaise and exertional dyspnea.  She initially presented to her primary care physician about 3 weeks ago and was thought to have a viral upper respiratory infection.   her symptoms have persisted and her labs with her primary care physician showed significant anemia and thrombocytopenia for which she was directed to the emergency room for further evaluation.  She denied any overt evidence of bleeding no hematochezia no hematemesis no melena no epistaxis no gum bleeding.  Denies any issues with excessive bruising.  In the emergency room the patient had labs done which showed a hemoglobin of 8.7 with an MCV of 104.8, WBC count of 6.7 with  severe neutropenia with an ANC of  of 100 some lymphocytosis and about 5000.  And significant thrombocytopenia with platelets of 29k which were confirmed by peripheral blood smear to rule out clumping.  Patient was noted to have a normal CBC in September 2018.  She was admitted for expedited workup and is going down for a bone marrow biopsy today by interventional radiology.  She notes no fevers no chills no night sweats no chest pain no overt shortness of breath.  No abdominal pain. Denies issues  with abnormal blood counts in the past. No skin rashes. Denies being on any overt new medications recently.  She was noted to have some headaches and had a CT of the head which shows no acute intra-cranial lesions or bleeding.   MEDICAL HISTORY:  Past Medical History:  Diagnosis Date  . Arthritis    R shoulder, both knees & low back- degenerative   . Asthma    as a child  . Complete heart block ([place])    s/p PPM  . Complication of anesthesia 2011   told that it as hard to pass tube when they intubated for pacemaker insertion   . Coronary artery disease    s/p PCI  . Family history of adverse reaction to anesthesia    father hard to put to sleep but he also used a lot alcohol    . Giant cell arteritis ([facility])    polymyalgia rheumatica  . H/O: hysterectomy   . History of hiatal hernia   . Hyperlipidemia   . Hypertension    not treated with med.   . Irritable bowel syndrome   . Polymyalgia rheumatica ([place])   . Presence of permanent cardiac pacemaker   . S/P breast biopsy    secondary to nipple discharge    SURGICAL HISTORY: Past Surgical History:  Procedure Laterality Date  . ABDOMINAL HYSTERECTOMY    . APPENDECTOMY    . BREAST BIOPSY     secondary to nipple discharge  . CARDIAC CATHETERIZATION  2001   x1stent - [facility]   . CESAREAN SECTION    . CHOLECYSTECTOMY    . EYE SURGERY  cataracts removed & IOL- both eyes   . INSERT / REPLACE / REMOVE PACEMAKER    . KNEE ARTHROSCOPY Left   . LUMBAR LAMINECTOMY/DECOMPRESSION MICRODISCECTOMY Left 06/08/2015   Procedure: Left Lumbar Two-Three Microdiskectomy;  Surgeon: Kary Kos, MD;  Location: [facility];  Service: Neurosurgery;  Laterality: Left;  Left L2-3 Microdiskectomy  . PACEMAKER INSERTION  11/15/2007   [employer] Jude Pacemaker Zephyr XL implanted in [place]  . pilondial cyst resection    . PPM GENERATOR CHANGEOUT N/A 12/27/2016   [facility] Assurity MRI generator change by Dr Rayann Heman  . TEMPORAL ARTERY BIOPSY / LIGATION    .  TONSILLECTOMY      SOCIAL HISTORY: Social History   Socioeconomic History  . Marital status: Married    Spouse name: Not on file  . Number of children: Not on file  . Years of education: Not on file  . Highest education level: Not on file  Occupational History  . Not on file  Social Needs  . Financial resource strain: Not on file  . Food insecurity:    Worry: Not on file    Inability: Not on file  . Transportation needs:    Medical: Not on file    Non-medical: Not on file  Tobacco Use  . Smoking status: Never Smoker  . Smokeless tobacco: Never Used  Substance and Sexual Activity  . Alcohol use: No  . Drug use: No  . Sexual activity: Not on file  Lifestyle  . Physical activity:    Days per week: Not on file    Minutes per session: Not on file  . Stress: Not on file  Relationships  . Social connections:    Talks on phone: Not on file    Gets together: Not on file    Attends religious service: Not on file    Active member of club or organization: Not on file    Attends meetings of clubs or organizations: Not on file    Relationship status: Not on file  . Intimate partner violence:    Fear of current or ex partner: Not on file    Emotionally abused: Not on file    Physically abused: Not on file    Forced sexual activity: Not on file  Other Topics Concern  . Not on file  Social History Narrative  . Not on file    FAMILY HISTORY: Family History  Problem Relation Age of Onset  . Heart attack Father 91  . Heart Problems Mother        PPM  . Cancer Unknown     ALLERGIES:  is allergic to hydrocodone; codeine; cymbalta [duloxetine hcl]; and lyrica [pregabalin].  MEDICATIONS:  Current Facility-Administered Medications  Medication Dose Route Frequency Provider Last Rate Last Dose  . acetaminophen (TYLENOL) tablet 650 mg  650 mg Oral Q6H PRN Opyd, Ilene Qua, MD       Or  . acetaminophen (TYLENOL) suppository 650 mg  650 mg Rectal Q6H PRN Opyd, Ilene Qua, MD        . atorvastatin (LIPITOR) tablet 20 mg  20 mg Oral q1800 Opyd, Ilene Qua, MD   20 mg at 07/11/17 1820  . ondansetron (ZOFRAN) tablet 4 mg  4 mg Oral Q6H PRN Opyd, Ilene Qua, MD       Or  . ondansetron (ZOFRAN) injection 4 mg  4 mg Intravenous Q6H PRN Opyd, Ilene Qua, MD      . sodium chloride flush (NS) 0.9 % injection 3  mL  3 mL Intravenous Q12H Opyd, Ilene Qua, MD   3 mL at 07/11/17 1956  . traMADol (ULTRAM) tablet 50 mg  50 mg Oral TID PRN Opyd, Ilene Qua, MD       . Scheduled Meds: . atorvastatin  20 mg Oral q1800  . sodium chloride flush  3 mL Intravenous Q12H   Continuous Infusions: PRN Meds:.acetaminophen **OR** acetaminophen, ondansetron **OR** ondansetron (ZOFRAN) IV, traMADol   REVIEW OF SYSTEMS:    10 Point review of Systems was done is negative except as noted above.  PHYSICAL EXAMINATION: ECOG PERFORMANCE STATUS: 2 - Symptomatic, <50% confined to bed  . Vitals:   07/10/17 2130 07/10/17 2237  BP: 128/62 (!) 140/49  Pulse: 88 100  Resp: 18 19  Temp:  98.3 F (36.8 C)  SpO2: 96% 99%   Filed Weights   07/10/17 1116 07/10/17 2237  Weight: 131 lb (59.4 kg) 131 lb (59.4 kg)   .Body mass index is 24.75 kg/m.  GENERAL:alert, in no acute distress and comfortable SKIN: no acute rashes, no significant lesions EYES: conjunctiva are pink and non-injected, sclera anicteric OROPHARYNX: MMM, no exudates, no oropharyngeal erythema or ulceration NECK: supple, no JVD LYMPH:  no palpable lymphadenopathy in the cervical, axillary or inguinal regions LUNGS: clear to auscultation b/l with normal respiratory effort HEART: regular rate & rhythm ABDOMEN:  normoactive bowel sounds , non tender, not distended.  Spleen just palpable Extremity: no pedal edema PSYCH: alert & oriented x 3 with fluent speech NEURO: no focal motor/sensory deficits  LABORATORY DATA:  I have reviewed the data as listed  . CBC Latest Ref Rng & Units 07/10/2017 07/10/2017 12/26/2016  WBC 4.0 - 10.5 K/uL -  6.7 4.1  Hemoglobin 12.0 - 15.0 g/dL - 8.7(L) 13.5  Hematocrit 36.0 - 46.0 % - 26.2(L) 40.6  Platelets 150 - 400 K/uL 26(LL) 29(LL) 215   . CBC    Component Value Date/Time   WBC 7.5 07/12/2017 0228   RBC 2.14 (L) 07/12/2017 0228   HGB 7.5 (L) 07/12/2017 0228   HGB 13.5 12/26/2016 1258   HCT 22.6 (L) 07/12/2017 0228   HCT 40.6 12/26/2016 1258   PLT 23 (LL) 07/12/2017 0228   PLT 215 12/26/2016 1258   MCV 105.6 (H) 07/12/2017 0228   MCV 89 12/26/2016 1258   MCH 35.0 (H) 07/12/2017 0228   MCHC 33.2 07/12/2017 0228   RDW 15.2 07/12/2017 0228   RDW 13.9 12/26/2016 1258   LYMPHSABS 5.3 (H) 07/12/2017 0228   LYMPHSABS 1.9 12/26/2016 1258   MONOABS 2.1 (H) 07/12/2017 0228   EOSABS 0.0 07/12/2017 0228   EOSABS 0.0 12/26/2016 1258   BASOSABS 0.0 07/12/2017 0228   BASOSABS 0.0 12/26/2016 1258    . CMP Latest Ref Rng & Units 07/10/2017 12/26/2016 06/01/2015  Glucose 65 - 99 mg/dL 107(H) 112(H) 92  BUN 6 - 20 mg/dL '18 13 18  ' Creatinine 0.44 - 1.00 mg/dL 0.83 0.78 0.85  Sodium 135 - 145 mmol/L 139 141 141  Potassium 3.5 - 5.1 mmol/L 3.9 4.4 4.7  Chloride 101 - 111 mmol/L 104 101 102  CO2 22 - 32 mmol/L '23 26 27  ' Calcium 8.9 - 10.3 mg/dL 9.2 9.6 10.0  Total Protein 6.5 - 8.1 g/dL 6.8 - -  Total Bilirubin 0.3 - 1.2 mg/dL 1.0 - -  Alkaline Phos 38 - 126 U/L 42 - -  AST 15 - 41 U/L 22 - -  ALT 14 - 54 U/L 26 - -  Component     Latest Ref Rng & Units 07/10/2017 07/11/2017  Iron     28 - 170 ug/dL 108   TIBC     250 - 450 ug/dL 225 (L)   Saturation Ratios     10.4 - 31.8 % 48 (H)   UIBC     ug/dL 117   LDH     98 - 192 U/L 235 (H) 197 (H)  Haptoglobin     34 - 200 mg/dL 137   Vitamin B12     180 - 914 pg/mL 1,083 (H)   Beta-2 Microglobulin     0.6 - 2.4 mg/L  2.4   Component     Latest Ref Rng & Units 07/10/2017  Prothrombin Time     11.4 - 15.2 seconds 15.7 (H)  INR      1.26  APTT     24 - 36 seconds 37 (H)  Fibrinogen     210 - 475 mg/dL 359  D-Dimer, Quant      0.00 - 0.50 ug/mL-FEU 0.75 (H)  Platelets     150 - 400 K/uL 26 (LL)  Smear Review      NO SCHISTOCYTES SEEN   RADIOGRAPHIC STUDIES: I have personally reviewed the radiological images as listed and agreed with the findings in the report. Dg Chest 2 View  Result Date: 07/10/2017 CLINICAL DATA:  Shortness of breath beginning several weeks ago. Low back pain. EXAM: CHEST - 2 VIEW COMPARISON:  None. FINDINGS: Heart size is normal. Dual lead pacemaker is in place. The leads are positioned in the right ventricle and right atrium. The lungs are clear. There is no edema or effusion. Surgical clips are present over the right upper quadrant. IMPRESSION: No active cardiopulmonary disease. Electronically Signed   By: San Morelle M.D.   On: 07/10/2017 12:27   Ct Head Wo Contrast  Result Date: 07/10/2017 CLINICAL DATA:  84 year old female with headache. Low platelets. Initial encounter. EXAM: CT HEAD WITHOUT CONTRAST TECHNIQUE: Contiguous axial images were obtained from the base of the skull through the vertex without intravenous contrast. COMPARISON:  12/03/2015 CT. FINDINGS: Brain: No intracranial hemorrhage or CT evidence of large acute infarct. Mild chronic microvascular changes. No intracranial mass lesion noted on this unenhanced exam. Vascular: Vascular calcifications. Skull: Negative Sinuses/Orbits: No acute orbital abnormality. Visualized paranasal sinuses are clear. Other: Mastoid air cells and middle ear cavities are clear. IMPRESSION: No acute intracranial abnormality. Chronic microvascular changes. Electronically Signed   By: Genia Del M.D.   On: 07/10/2017 18:33    ASSESSMENT & PLAN:   84 year old female with multiple medical comorbidities with newly noted Pancytopenia  1) Macrocytic Anemia Hgb 8.7 with MCV of 105 and normal RDW on admission was WNL 13.5 in 12/2016. 2) Severe thrombocytopenia PLT 29k - no overt bleeding. No platelet clumping noted. 3) Severe neutropenia ANC 100 with  lymphocytosis B12 within normal limits RBC folate pending LDH high normal with normal haptoglobin suggesting against overt intravascular hemolysis at this time.  No recent new medications that overtly explain her new pancytopenia. She did have a recent  possible viral infection about 3 weeks ago and her bone marrow suppression could certainly be from this.  Peripheral blood smear was reviewed -no overt increase in schistocytes or evidence of microspherocytes to suggest hemolysis.  She does have significant neutropenia.  Atypical lymphocytes with some increase in LGL's.  Some abnormal larger lymphocytes versus blastoid cells.  Plan -Given the patient's age 70 is certainly  on the differential.  Given the timeline this would suggest a high-grade process. -Cannot rule out an infiltrative process such as leukemia or lymphoma with bone marrow involvement . -T-cell lymphoma large granular leukemia thought on the differential based on her peripheral blood smear. -Patient had an urgent bone marrow on 07/12/18 which should-hopefully shed additional light on the process. -transfuse PRBCs prn to maintain Hgb>8  -transfuse Platelets prn to maintain Plt>20k in setting of slightly abnormal coags. -will f/u with Prelim bone marrow biopsy results hopefully on 4/9 -possibilities discussed with patient -treatment based on findings.  All of the patients questions were answered with apparent satisfaction. The patient knows to call the clinic with any problems, questions or concerns.  I spent 50 minutes counseling the patient face to face. The total time spent in the appointment was 60 minutes and more than 50% was on counseling and direct patient cares.    Sullivan Lone MD [place] AAHIVMS [facility] Hematology/Oncology Physician [facility]  (Office):       [phone] (Work cell):  [phone] (Fax):           [phone]

## 2017-07-10 NOTE — ED Notes (Signed)
Main lab to add on diff, HFP, and PT

## 2017-07-10 NOTE — ED Notes (Signed)
Notified Dr. Tyrone Nine patient has platelet count of 29 and working to get patient into a bed.  No new orders

## 2017-07-10 NOTE — ED Notes (Signed)
Attempted report x 1; name and call back number provided 

## 2017-07-11 ENCOUNTER — Inpatient Hospital Stay (HOSPITAL_COMMUNITY): Payer: Medicare Other

## 2017-07-11 ENCOUNTER — Other Ambulatory Visit: Payer: Self-pay

## 2017-07-11 DIAGNOSIS — E785 Hyperlipidemia, unspecified: Secondary | ICD-10-CM

## 2017-07-11 DIAGNOSIS — I2510 Atherosclerotic heart disease of native coronary artery without angina pectoris: Secondary | ICD-10-CM

## 2017-07-11 DIAGNOSIS — M353 Polymyalgia rheumatica: Secondary | ICD-10-CM

## 2017-07-11 DIAGNOSIS — D709 Neutropenia, unspecified: Secondary | ICD-10-CM

## 2017-07-11 DIAGNOSIS — I10 Essential (primary) hypertension: Secondary | ICD-10-CM

## 2017-07-11 DIAGNOSIS — K449 Diaphragmatic hernia without obstruction or gangrene: Secondary | ICD-10-CM

## 2017-07-11 DIAGNOSIS — M129 Arthropathy, unspecified: Secondary | ICD-10-CM

## 2017-07-11 DIAGNOSIS — D61818 Other pancytopenia: Secondary | ICD-10-CM

## 2017-07-11 LAB — BASIC METABOLIC PANEL
Anion gap: 6 (ref 5–15)
BUN: 17 mg/dL (ref 6–20)
CALCIUM: 8.3 mg/dL — AB (ref 8.9–10.3)
CO2: 24 mmol/L (ref 22–32)
Chloride: 109 mmol/L (ref 101–111)
Creatinine, Ser: 0.74 mg/dL (ref 0.44–1.00)
GFR calc Af Amer: >60 mL/min (ref >60)
Glucose, Bld: 99 mg/dL (ref 65–99)
POTASSIUM: 3.8 mmol/L (ref 3.5–5.1)
SODIUM: 139 mmol/L (ref 135–145)

## 2017-07-11 LAB — CBC
HEMATOCRIT: 23.6 % — AB (ref 36.0–46.0)
Hemoglobin: 7.9 g/dL — ABNORMAL LOW (ref 12.0–15.0)
MCH: 35.3 pg — ABNORMAL HIGH (ref 26.0–34.0)
MCHC: 33.5 g/dL (ref 30.0–36.0)
MCV: 105.4 fL — ABNORMAL HIGH (ref 78.0–100.0)
Platelets: 26 10*3/uL — CL (ref 150–400)
RBC: 2.24 MIL/uL — ABNORMAL LOW (ref 3.87–5.11)
RDW: 15 % (ref 11.5–15.5)
WBC: 7 10*3/uL (ref 4.0–10.5)

## 2017-07-11 LAB — PATHOLOGIST SMEAR REVIEW

## 2017-07-11 LAB — LACTATE DEHYDROGENASE: LDH: 197 U/L — AB (ref 98–192)

## 2017-07-11 MED ORDER — LIDOCAINE HCL 1 % IJ SOLN
INTRAMUSCULAR | Status: AC
  Filled 2017-07-11: qty 20

## 2017-07-11 MED ORDER — MIDAZOLAM HCL 2 MG/2ML IJ SOLN
INTRAMUSCULAR | Status: AC | PRN
  Administered 2017-07-11: 1 mg via INTRAVENOUS

## 2017-07-11 MED ORDER — MIDAZOLAM HCL 2 MG/2ML IJ SOLN
INTRAMUSCULAR | Status: AC
  Filled 2017-07-11: qty 6

## 2017-07-11 MED ORDER — FENTANYL CITRATE (PF) 100 MCG/2ML IJ SOLN
INTRAMUSCULAR | Status: AC | PRN
  Administered 2017-07-11 (×2): 50 ug via INTRAVENOUS

## 2017-07-11 MED ORDER — FENTANYL CITRATE (PF) 100 MCG/2ML IJ SOLN
INTRAMUSCULAR | Status: AC
  Filled 2017-07-11: qty 4

## 2017-07-11 NOTE — Sedation Documentation (Signed)
Patient is resting comfortably. 

## 2017-07-11 NOTE — Progress Notes (Signed)
Hematology/Oncology consult received. Patient will be seen today. Needs BM Bx today  Sullivan Lone MD MS

## 2017-07-11 NOTE — Plan of Care (Signed)
  Problem: Health Behavior/Discharge Planning: Goal: Ability to manage health-related needs will improve Outcome: Progressing   Problem: Safety: Goal: Ability to remain free from injury will improve Outcome: Progressing   Problem: Education: Goal: Knowledge of General Education information will improve Outcome: Completed/Met   Problem: Clinical Measurements: Goal: Respiratory complications will improve Outcome: Completed/Met Goal: Cardiovascular complication will be avoided Outcome: Completed/Met   Problem: Skin Integrity: Goal: Risk for impaired skin integrity will decrease Outcome: Completed/Met

## 2017-07-11 NOTE — Sedation Documentation (Signed)
Pressure dsg low back intact

## 2017-07-11 NOTE — Progress Notes (Signed)
PROGRESS NOTE    HAILE TOPPINS   WCH:[id number]  DOB: 07-Aug-1933  DOA: 07/10/2017 PCP: Prince Solian, MD   Brief Narrative:  Violeta Gelinas 84 y.o. female with medical history significant for complete heart block status post pacer placement, coronary artery disease with stent, and arthritis using tramadol, now presenting to the emergency department with about 3 weeks of general malaise, exertional dyspnea, and abnormal CBC.  Patient reports that she noted the insidious development of exertional dyspnea and nonspecific malaise approximately 3 weeks ago, saw her PCP at that time, was suspected to have a viral URI, but reports that her exertional dyspnea and malaise have persisted.  She reports seeing her PCP for a annual physical, had blood work obtained as part of this, was noted to have a new anemia and marked thrombocytopenia, and was directed to the ED for further evaluation.  She denies any rash, denies melena, denies hematochezia, denies epistaxis or bleeding from her gums, and denies any easy bruising or bleeding. ED labwork: White count 6.7, hemoglobin 8.7, platelets 29 Bone marrow biopsy ordered and hematology consulted  Subjective: She has no new compliants. Asking when biopsy will be. ROS: no complaints of nausea, vomiting, constipation diarrhea, cough, dyspnea or dysuria. No other complaints.   Assessment & Plan:   Principal Problem:    Macrocytic anemia/Thrombocytopenia / neutropenia   Slightly Elevated PT/ PTT and D dimer - Hb 13.5 in 9/18- 8.7 on admission and has dropped by a gram to 7.9 - Plt 215 in 9/18, 29 on admission and about the same at 26 today - smear>> Neutropenia with few atypical lymphocytes , no schistocytes  - add neutropenic precautions - avoid anticoagulation and NSAIDs -  Anemia panel reviewed  - has had BM biopsy today -  Numerous labs pending- oncology to see later today  Active Problems:   Coronary atherosclerosis with stent - aspirin  stopped    Complete heart block  - pacer  DVT prophylaxis: SCDs Code Status: Full code Family Communication:  Disposition Plan: home Consultants:   oncology Procedures:   Bone marrow biopsy Antimicrobials:  Anti-infectives (From admission, onward)   None       Objective: Vitals:   07/11/17 1033 07/11/17 1045 07/11/17 1049 07/11/17 1100  BP: (!) 117/49 (!) 103/31 (!) 125/52 (!) 131/45  Pulse: 78 80 80 77  Resp: 15 (!) _0 Temp:      TempSrc:      SpO2: 100% 100% 100% 98%  Weight:      Height:        Intake/Output Summary (Last 24 hours) at 07/11/2017 1439 Last data filed at 07/11/2017 0412 Gross per 24 hour  Intake 585 ml  Output -  Net 585 ml   Filed Weights   07/10/17 1116 07/10/17 2237  Weight: 59.4 kg (131 lb) 59.4 kg (131 lb)    Examination: General exam: Appears comfortable  HEENT: PERRLA, oral mucosa moist, no sclera icterus or thrush Respiratory system: Clear to auscultation. Respiratory effort normal. Cardiovascular system: S1 & S2 heard, RRR.   Gastrointestinal system: Abdomen soft, non-tender, nondistended. Normal bowel sound. No organomegaly Central nervous system: Alert and oriented. No focal neurological deficits. Extremities: No cyanosis, clubbing or edema Skin: No rashes or ulcers Psychiatry:  Mood & affect appropriate.     Data Reviewed: I have personally reviewed following labs and imaging studies  CBC: Recent Labs  Lab 07/10/17 1130 07/10/17 1848 07/11/17 0244  WBC 6.7  --  7.0  NEUTROABS 0.1*  --   --   HGB 8.7*  --  7.9*  HCT 26.2*  --  23.6*  MCV 104.8*  --  105.4*  PLT 29* 26* 26*   Basic Metabolic Panel: Recent Labs  Lab 07/10/17 1130 07/11/17 0244  NA 139 139  K 3.9 3.8  CL 104 109  CO2 23 24  GLUCOSE 107* 99  BUN 18 17  CREATININE 0.83 0.74  CALCIUM 9.2 8.3*   GFR: Estimated Creatinine Clearance: 44.8 mL/min (by C-G formula based on SCr of 0.74 mg/dL). Liver Function Tests: Recent Labs  Lab  07/10/17 1130  AST 22  ALT 26  ALKPHOS 42  BILITOT 1.0  PROT 6.8  ALBUMIN 3.7   No results for input(s): LIPASE, AMYLASE in the last 168 hours. No results for input(s): AMMONIA in the last 168 hours. Coagulation Profile: Recent Labs  Lab 07/10/17 1130 07/10/17 1848  INR 1.13 1.26   Cardiac Enzymes: No results for input(s): CKTOTAL, CKMB, CKMBINDEX, TROPONINI in the last 168 hours. BNP (last 3 results) No results for input(s): PROBNP in the last 8760 hours. HbA1C: No results for input(s): HGBA1C in the last 72 hours. CBG: No results for input(s): GLUCAP in the last 168 hours. Lipid Profile: No results for input(s): CHOL, HDL, LDLCALC, TRIG, CHOLHDL, LDLDIRECT in the last 72 hours. Thyroid Function Tests: No results for input(s): TSH, T4TOTAL, FREET4, T3FREE, THYROIDAB in the last 72 hours. Anemia Panel: Recent Labs    07/10/17 1630 07/10/17 1848  VITAMINB12 1,083*  --   TIBC 225*  --   IRON 108  --   RETICCTPCT  --  1.1   Urine analysis: No results found for: COLORURINE, APPEARANCEUR, LABSPEC, PHURINE, GLUCOSEU, HGBUR, BILIRUBINUR, KETONESUR, PROTEINUR, UROBILINOGEN, NITRITE, LEUKOCYTESUR Sepsis Labs: _0 (procalcitonin:4,lacticidven:4) )No results found for this or any previous visit (from the past 240 hour(s)).       Radiology Studies: Dg Chest 2 View  Result Date: 07/10/2017 CLINICAL DATA:  Shortness of breath beginning several weeks ago. Low back pain. EXAM: CHEST - 2 VIEW COMPARISON:  None. FINDINGS: Heart size is normal. Dual lead pacemaker is in place. The leads are positioned in the right ventricle and right atrium. The lungs are clear. There is no edema or effusion. Surgical clips are present over the right upper quadrant. IMPRESSION: No active cardiopulmonary disease. Electronically Signed   By: San Morelle M.D.   On: 07/10/2017 12:27   Ct Head Wo Contrast  Result Date: 07/10/2017 CLINICAL DATA:  84 year old female with headache. Low  platelets. Initial encounter. EXAM: CT HEAD WITHOUT CONTRAST TECHNIQUE: Contiguous axial images were obtained from the base of the skull through the vertex without intravenous contrast. COMPARISON:  12/03/2015 CT. FINDINGS: Brain: No intracranial hemorrhage or CT evidence of large acute infarct. Mild chronic microvascular changes. No intracranial mass lesion noted on this unenhanced exam. Vascular: Vascular calcifications. Skull: Negative Sinuses/Orbits: No acute orbital abnormality. Visualized paranasal sinuses are clear. Other: Mastoid air cells and middle ear cavities are clear. IMPRESSION: No acute intracranial abnormality. Chronic microvascular changes. Electronically Signed   By: Genia Del M.D.   On: 07/10/2017 18:33   Ct Bone Marrow Biopsy  Result Date: 07/11/2017 INDICATION: ANEMIA, THROMBOCYTOPENIA EXAM: CT GUIDED RIGHT ILIAC BONE MARROW ASPIRATION AND CORE BIOPSY Date:  07/11/2017 07/11/2017 10:26 am Radiologist:  M. Daryll Brod, MD Guidance:  CT FLUOROSCOPY TIME:  Fluoroscopy Time: None. MEDICATIONS: None. ANESTHESIA/SEDATION: 1.0 mg IV Versed; 100 mcg IV Fentanyl Moderate Sedation Time:  10 minutes  The patient was continuously monitored during the procedure by the interventional radiology nurse under my direct supervision. CONTRAST:  None. COMPLICATIONS: None PROCEDURE: Informed consent was obtained from the patient following explanation of the procedure, risks, benefits and alternatives. The patient understands, agrees and consents for the procedure. All questions were addressed. A time out was performed. The patient was positioned prone and non-contrast localization CT was performed of the pelvis to demonstrate the iliac marrow spaces. Maximal barrier sterile technique utilized including caps, mask, sterile gowns, sterile gloves, large sterile drape, hand hygiene, and Betadine prep. Under sterile conditions and local anesthesia, an 11 gauge coaxial bone biopsy needle was advanced into the right  iliac marrow space. Needle position was confirmed with CT imaging. Initially, bone marrow aspiration was performed. Next, the 11 gauge outer cannula was utilized to obtain a right iliac bone marrow core biopsy. Needle was removed. Hemostasis was obtained with compression. The patient tolerated the procedure well. Samples were prepared with the cytotechnologist. No immediate complications. IMPRESSION: CT guided right iliac bone marrow aspiration and core biopsy. Electronically Signed   By: Jerilynn Mages.  Shick M.D.   On: 07/11/2017 11:23      Scheduled Meds: . atorvastatin  20 mg Oral q1800  . fentaNYL      . lidocaine      . midazolam      . sodium chloride flush  3 mL Intravenous Q12H   Continuous Infusions:   LOS: 1 day    Time spent in minutes: [place], MD [facility] Pager: [email or web address] Password [facility] 07/11/2017, 2:39 PM

## 2017-07-11 NOTE — Progress Notes (Signed)
Pt returned from biopsy. Site stable. VSS. Call bell and phone within reach. Husband at bedside. Will continue to monitor.

## 2017-07-11 NOTE — Procedures (Signed)
Anemia, thrombocytopenia  S/p CT RT ILIAC BM ASP AND CORE  No comp Stable EBL MIN PATH PENDING FULL REPORT IN PACS

## 2017-07-11 NOTE — Consult Note (Signed)
Chief Complaint: Patient was seen in consultation today for Bone Marrow biopsy Chief Complaint  Patient presents with  . Shortness of Breath   at the request of Dr Reggy Eye  Referring Physician(s): Dr Carolyne Fiscal  Supervising Physician: Daryll Brod  Patient Status: [facility] - In-pt  History of Present Illness: Jamie Reese is a 84 y.o. female   Heart block-- pacer CAD/stent General malaise; dyspnea Known abnormal cbc per PCP (evaluation with PCP 3 weeks ago and tx for viral URI) Anemia and marled thrombocytopenia Presented to ED last night for evaluation with persistent symptoms  HemOnc recommendation of BM Bx  Scheduled now for same  Past Medical History:  Diagnosis Date  . Arthritis    R shoulder, both knees & low back- degenerative   . Asthma    as a child  . Complete heart block ([place])    s/p PPM  . Complication of anesthesia 2011   told that it as hard to pass tube when they intubated for pacemaker insertion   . Coronary artery disease    s/p PCI  . Family history of adverse reaction to anesthesia    father hard to put to sleep but he also used a lot alcohol    . Giant cell arteritis (HCC)    polymyalgia rheumatica  . H/O: hysterectomy   . History of hiatal hernia   . Hyperlipidemia   . Hypertension    not treated with med.   . Irritable bowel syndrome   . Polymyalgia rheumatica ([place])   . Presence of permanent cardiac pacemaker   . S/P breast biopsy    secondary to nipple discharge    Past Surgical History:  Procedure Laterality Date  . ABDOMINAL HYSTERECTOMY    . APPENDECTOMY    . BREAST BIOPSY     secondary to nipple discharge  . CARDIAC CATHETERIZATION  2001   x1stent - Alfred Little   . CESAREAN SECTION    . CHOLECYSTECTOMY    . EYE SURGERY     cataracts removed & IOL- both eyes   . INSERT / REPLACE / REMOVE PACEMAKER    . KNEE ARTHROSCOPY Left   . LUMBAR LAMINECTOMY/DECOMPRESSION MICRODISCECTOMY Left 06/08/2015   Procedure: Left Lumbar  Two-Three Microdiskectomy;  Surgeon: Kary Kos, MD;  Location: [place] [facility];  Service: Neurosurgery;  Laterality: Left;  Left L2-3 Microdiskectomy  . PACEMAKER INSERTION  11/15/2007   St. Jude Pacemaker Zephyr XL implanted in [place]  . pilondial cyst resection    . PPM GENERATOR CHANGEOUT N/A 12/27/2016   SJM Assurity MRI generator change by Dr Rayann Heman  . TEMPORAL ARTERY BIOPSY / LIGATION    . TONSILLECTOMY      Allergies: Hydrocodone; Codeine; Cymbalta [duloxetine hcl]; and Lyrica [pregabalin]  Medications: Prior to Admission medications   Medication Sig Start Date End Date Taking? Authorizing Provider  aspirin EC 81 MG tablet Take 81 mg by mouth daily.   Yes [provider]  atorvastatin (LIPITOR) 20 MG tablet Take 20 mg by mouth daily at 6 PM.    Yes [provider]  beta carotene w/minerals (OCUVITE) tablet Take 1 tablet by mouth 2 (two) times daily. Also [place] presivision   Yes [provider]  ergocalciferol (VITAMIN D2) 50000 UNITS capsule Take 50,000 Units by mouth once a week. Sunday   Yes [provider]  Multiple Vitamin (MULTIVITAMIN WITH MINERALS) TABS tablet Take 1 tablet by mouth daily.   Yes [provider]  Probiotic Product (  ALIGN PO) Take 1 tablet by mouth daily.   Yes [provider]  traMADol (ULTRAM) 50 MG tablet Take 50 mg by mouth 3 (three) times daily as needed (pain).    Yes [provider]     Family History  Problem Relation Age of Onset  . Heart attack Father 79  . Heart Problems Mother        PPM  . Cancer Unknown     Social History   Socioeconomic History  . Marital status: Married    Spouse name: Not on file  . Number of children: Not on file  . Years of education: Not on file  . Highest education level: Not on file  Occupational History  . Not on file  Social Needs  . Financial resource strain: Not on file  . Food insecurity:    Worry: Not on file    Inability: Not on file  .  Transportation needs:    Medical: Not on file    Non-medical: Not on file  Tobacco Use  . Smoking status: Never Smoker  . Smokeless tobacco: Never Used  Substance and Sexual Activity  . Alcohol use: No  . Drug use: No  . Sexual activity: Not on file  Lifestyle  . Physical activity:    Days per week: Not on file    Minutes per session: Not on file  . Stress: Not on file  Relationships  . Social connections:    Talks on phone: Not on file    Gets together: Not on file    Attends religious service: Not on file    Active member of club or organization: Not on file    Attends meetings of clubs or organizations: Not on file    Relationship status: Not on file  Other Topics Concern  . Not on file  Social History Narrative  . Not on file    Review of Systems: A 12 point ROS discussed and pertinent positives are indicated in the HPI above.  All other systems are negative.  Review of Systems  Constitutional: Positive for activity change and fatigue. Negative for fever.  Respiratory: Negative for cough and shortness of breath.   Cardiovascular: Negative for chest pain.  Gastrointestinal: Negative for abdominal pain.  Skin: Negative for color change.  Neurological: Positive for weakness.  Psychiatric/Behavioral: Negative for behavioral problems and confusion.    Vital Signs: BP (!) 118/44 (BP Location: Left Arm)   Pulse 81   Temp 98 F (36.7 C) (Oral)   Resp 10   Ht '5\' 1"'  (1.549 m)   Wt 131 lb (59.4 kg)   SpO2 95%   BMI 24.75 kg/m   Physical Exam  Constitutional: She is oriented to person, place, and time.  Cardiovascular: Normal rate and regular rhythm.  Pulmonary/Chest: Effort normal and breath sounds normal.  Abdominal: Soft. Bowel sounds are normal.  Musculoskeletal: Normal range of motion.  Neurological: She is alert and oriented to person, place, and time.  Skin: Skin is warm and dry.  Psychiatric: She has a normal mood and affect. Her behavior is normal.    Nursing note and vitals reviewed.   Imaging: Dg Chest 2 View  Result Date: 07/10/2017 CLINICAL DATA:  Shortness of breath beginning several weeks ago. Low back pain. EXAM: CHEST - 2 VIEW COMPARISON:  None. FINDINGS: Heart size is normal. Dual lead pacemaker is in place. The leads are positioned in the right ventricle and right atrium. The lungs are clear. There  is no edema or effusion. Surgical clips are present over the right upper quadrant. IMPRESSION: No active cardiopulmonary disease. Electronically Signed   By: San Morelle M.D.   On: 07/10/2017 12:27   Ct Head Wo Contrast  Result Date: 07/10/2017 CLINICAL DATA:  84 year old female with headache. Low platelets. Initial encounter. EXAM: CT HEAD WITHOUT CONTRAST TECHNIQUE: Contiguous axial images were obtained from the base of the skull through the vertex without intravenous contrast. COMPARISON:  12/03/2015 CT. FINDINGS: Brain: No intracranial hemorrhage or CT evidence of large acute infarct. Mild chronic microvascular changes. No intracranial mass lesion noted on this unenhanced exam. Vascular: Vascular calcifications. Skull: Negative Sinuses/Orbits: No acute orbital abnormality. Visualized paranasal sinuses are clear. Other: Mastoid air cells and middle ear cavities are clear. IMPRESSION: No acute intracranial abnormality. Chronic microvascular changes. Electronically Signed   By: Genia Del M.D.   On: 07/10/2017 18:33    Labs:  CBC: Recent Labs    12/26/16 1258 07/10/17 1130 07/10/17 1848 07/11/17 0244  WBC 4.1 6.7  --  7.0  HGB 13.5 8.7*  --  7.9*  HCT 40.6 26.2*  --  23.6*  PLT 215 29* 26* 26*    COAGS: Recent Labs    07/10/17 1130 07/10/17 1848  INR 1.13 1.26  APTT  --  37*    BMP: Recent Labs    12/26/16 1258 07/10/17 1130 07/11/17 0244  NA 141 139 139  K 4.4 3.9 3.8  CL 101 104 109  CO2 '26 23 24  ' GLUCOSE 112* 107* 99  BUN '13 18 17  ' CALCIUM 9.6 9.2 8.3*  CREATININE 0.78 0.83 0.74  GFRNONAA 72  >60 >60  GFRAA 82 >60 >60    LIVER FUNCTION TESTS: Recent Labs    07/10/17 1130  BILITOT 1.0  AST 22  ALT 26  ALKPHOS 42  PROT 6.8  ALBUMIN 3.7    TUMOR MARKERS: No results for input(s): AFPTM, CEA, CA199, CHROMGRNA in the last 8760 hours.  Assessment and Plan:  Anemia Thrombocytopenia Scheduled for Bone marrow bx today Risks and benefits discussed with the patient including, but not limited to bleeding, infection, damage to adjacent structures or low yield requiring additional tests.  All of the patient's questions were answered, patient is agreeable to proceed. Consent signed and in chart.   Thank you for this interesting consult.  I greatly enjoyed meeting ATTALLAH ONTKO and look forward to participating in their care.  A copy of this report was sent to the requesting provider on this date.  Electronically Signed: Lavonia Drafts, PA-C 07/11/2017, 8:14 AM   I spent a total of 20 Minutes    in face to face in clinical consultation, greater than 50% of which was counseling/coordinating care for Bone marrow bx

## 2017-07-12 DIAGNOSIS — D709 Neutropenia, unspecified: Secondary | ICD-10-CM

## 2017-07-12 DIAGNOSIS — C9200 Acute myeloblastic leukemia, not having achieved remission: Principal | ICD-10-CM

## 2017-07-12 DIAGNOSIS — D696 Thrombocytopenia, unspecified: Secondary | ICD-10-CM

## 2017-07-12 LAB — FOLATE RBC
FOLATE, RBC: 1264 ng/mL (ref >498)
Folate, Hemolysate: 303.3 ng/mL
HEMATOCRIT: 24 % — AB (ref 34.0–46.6)

## 2017-07-12 LAB — CBC WITH DIFFERENTIAL/PLATELET
BASOS ABS: 0 10*3/uL (ref 0.0–0.1)
Basophils Relative: 0 %
Eosinophils Absolute: 0 10*3/uL (ref 0.0–0.7)
Eosinophils Relative: 0 %
HCT: 22.6 % — ABNORMAL LOW (ref 36.0–46.0)
Hemoglobin: 7.5 g/dL — ABNORMAL LOW (ref 12.0–15.0)
LYMPHS ABS: 5.3 10*3/uL — AB (ref 0.7–4.0)
Lymphocytes Relative: 71 %
MCH: 35 pg — ABNORMAL HIGH (ref 26.0–34.0)
MCHC: 33.2 g/dL (ref 30.0–36.0)
MCV: 105.6 fL — ABNORMAL HIGH (ref 78.0–100.0)
MONO ABS: 2.1 10*3/uL — AB (ref 0.1–1.0)
MONOS PCT: 28 %
NEUTROS PCT: 1 %
Neutro Abs: 0.1 10*3/uL — ABNORMAL LOW (ref 1.7–7.7)
PLATELETS: 23 10*3/uL — AB (ref 150–400)
RBC: 2.14 MIL/uL — AB (ref 3.87–5.11)
RDW: 15.2 % (ref 11.5–15.5)
WBC: 7.5 10*3/uL (ref 4.0–10.5)

## 2017-07-12 LAB — BETA 2 MICROGLOBULIN, SERUM: BETA 2 MICROGLOBULIN: 2.4 mg/L (ref 0.6–2.4)

## 2017-07-12 LAB — PREPARE RBC (CROSSMATCH)

## 2017-07-12 LAB — HAPTOGLOBIN: Haptoglobin: 137 mg/dL (ref 34–200)

## 2017-07-12 LAB — KAPPA/LAMBDA LIGHT CHAINS
KAPPA, LAMDA LIGHT CHAIN RATIO: 0.81 (ref 0.26–1.65)
Kappa free light chain: 24.9 mg/L — ABNORMAL HIGH (ref 3.3–19.4)
LAMDA FREE LIGHT CHAINS: 30.7 mg/L — AB (ref 5.7–26.3)

## 2017-07-12 LAB — SEDIMENTATION RATE: Sed Rate: 60 mm/hr — ABNORMAL HIGH (ref 0–22)

## 2017-07-12 MED ORDER — SODIUM CHLORIDE 0.9 % IV SOLN
Freq: Once | INTRAVENOUS | Status: AC
  Administered 2017-07-12: 12:00:00 via INTRAVENOUS

## 2017-07-12 MED ORDER — B COMPLEX-C PO TABS
1.0000 | ORAL_TABLET | Freq: Every day | ORAL | Status: DC
  Administered 2017-07-12 – 2017-07-13 (×2): 1 via ORAL
  Filled 2017-07-12 (×2): qty 1

## 2017-07-12 NOTE — Progress Notes (Addendum)
HEMATOLOGY/ONCOLOGY INPATIENT PROGRESS NOTE  Date of Service: 07/12/2017  Inpatient Attending: .Velvet Bathe, MD   SUBJECTIVE:  Jamie Reese is accompanied by her husband at bedside and was seen in oncologic followup today. She reports no acute changes overnight. No overt bleeding. No fevers/chills/night sweats. Generalized fatigue. No other new focal symptoms. On review of symptoms she reports minimal gum pain, and denies other bleeding, and any other symptoms.  I discussed in details the bone marrow bx and other labs results and her new concerning diagnosis of AML, natural history, elements of evaluation, concerning overall prognosis, treatment options which are typically driven by genetics of the tumor but also largely determined by age and medical status. Patient and husband were given patient education material to read as well. She notes that she had a feeling it was not going to be good. She notes that she was functioning completely independently prior to this diagnosis and her other medical co-morbidities have been stable.  OBJECTIVE:  Some emotional distress as expected with new diagnosis.  PHYSICAL EXAMINATION: . Vitals:   07/12/17 1159 07/12/17 1207 07/12/17 1443 07/12/17 1515  BP: (!) 138/49 (!) 129/58 (!) 130/47 (!) 134/52  Pulse: 77  70 70  Resp: '14  14 18  ' Temp: 98.5 F (36.9 C) 98.5 F (36.9 C) 98.5 F (36.9 C) 98.6 F (37 C)  TempSrc: Oral Oral Oral Oral  SpO2: 98%  97% 97%  Weight:      Height:       Filed Weights   07/10/17 1116 07/10/17 2237 07/12/17 0500  Weight: 131 lb (59.4 kg) 131 lb (59.4 kg) 129 lb 8 oz (58.7 kg)   .Body mass index is 24.47 kg/m.  GENERAL:alert, in no acute distress and comfortable SKIN: skin color, texture, turgor are normal, no rashes or significant lesions EYES: conjunctival pallor and non-injected, sclera clear OROPHARYNX:no exudate, no erythema and lips, buccal mucosa, and tongue normal  NECK: supple, no JVD,  thyroid normal size, non-tender, without nodularity LYMPH:  no palpable lymphadenopathy in the cervical, axillary or inguinal LUNGS: clear to auscultation with normal respiratory effort HEART: regular rate & rhythm,  no murmurs and no lower extremity edema ABDOMEN: abdomen soft, non-tender, normoactive bowel sounds  Musculoskeletal: no cyanosis of digits and no clubbing  PSYCH: alert & oriented x 3 with fluent speech NEURO: no focal motor/sensory deficits  MEDICAL HISTORY:  Past Medical History:  Diagnosis Date  . Arthritis    R shoulder, both knees & low back- degenerative   . Asthma    as a child  . Complete heart block ([place])    s/p PPM  . Complication of anesthesia 2011   told that it as hard to pass tube when they intubated for pacemaker insertion   . Coronary artery disease    s/p PCI  . Family history of adverse reaction to anesthesia    father hard to put to sleep but he also used a lot alcohol    . Giant cell arteritis (HCC)    polymyalgia rheumatica  . H/O: hysterectomy   . History of hiatal hernia   . Hyperlipidemia   . Hypertension    not treated with med.   . Irritable bowel syndrome   . Polymyalgia rheumatica ([place])   . Presence of permanent cardiac pacemaker   . S/P breast biopsy    secondary to nipple discharge    SURGICAL HISTORY: Past Surgical History:  Procedure Laterality Date  . ABDOMINAL HYSTERECTOMY    .  APPENDECTOMY    . BREAST BIOPSY     secondary to nipple discharge  . CARDIAC CATHETERIZATION  2001   x1stent - Alfred Little   . CESAREAN SECTION    . CHOLECYSTECTOMY    . EYE SURGERY     cataracts removed & IOL- both eyes   . INSERT / REPLACE / REMOVE PACEMAKER    . KNEE ARTHROSCOPY Left   . LUMBAR LAMINECTOMY/DECOMPRESSION MICRODISCECTOMY Left 06/08/2015   Procedure: Left Lumbar Two-Three Microdiskectomy;  Surgeon: Kary Kos, MD;  Location: [place] [facility];  Service: Neurosurgery;  Laterality: Left;  Left L2-3 Microdiskectomy  . PACEMAKER  INSERTION  11/15/2007   St. Jude Pacemaker Zephyr XL implanted in [place]  . pilondial cyst resection    . PPM GENERATOR CHANGEOUT N/A 12/27/2016   SJM Assurity MRI generator change by Dr Rayann Heman  . TEMPORAL ARTERY BIOPSY / LIGATION    . TONSILLECTOMY      SOCIAL HISTORY: Social History   Socioeconomic History  . Marital status: Married    Spouse name: Not on file  . Number of children: Not on file  . Years of education: Not on file  . Highest education level: Not on file  Occupational History  . Not on file  Social Needs  . Financial resource strain: Not on file  . Food insecurity:    Worry: Not on file    Inability: Not on file  . Transportation needs:    Medical: Not on file    Non-medical: Not on file  Tobacco Use  . Smoking status: Never Smoker  . Smokeless tobacco: Never Used  Substance and Sexual Activity  . Alcohol use: No  . Drug use: No  . Sexual activity: Not on file  Lifestyle  . Physical activity:    Days per week: Not on file    Minutes per session: Not on file  . Stress: Not on file  Relationships  . Social connections:    Talks on phone: Not on file    Gets together: Not on file    Attends religious service: Not on file    Active member of club or organization: Not on file    Attends meetings of clubs or organizations: Not on file    Relationship status: Not on file  . Intimate partner violence:    Fear of current or ex partner: Not on file    Emotionally abused: Not on file    Physically abused: Not on file    Forced sexual activity: Not on file  Other Topics Concern  . Not on file  Social History Narrative  . Not on file    FAMILY HISTORY: Family History  Problem Relation Age of Onset  . Heart attack Father 31  . Heart Problems Mother        PPM  . Cancer Unknown     ALLERGIES:  is allergic to hydrocodone; codeine; cymbalta [duloxetine hcl]; and lyrica [pregabalin].  MEDICATIONS:  Scheduled Meds: . atorvastatin  20 mg Oral q1800  .  B-complex with vitamin C  1 tablet Oral Daily  . sodium chloride flush  3 mL Intravenous Q12H   Continuous Infusions: PRN Meds:.acetaminophen **OR** acetaminophen, ondansetron **OR** ondansetron (ZOFRAN) IV, traMADol  REVIEW OF SYSTEMS:    10 Point review of Systems was done is negative except as noted above.   LABORATORY DATA:  I have reviewed the data as listed  . CBC Latest Ref Rng & Units 07/12/2017 07/11/2017 07/10/2017  WBC 4.0 -  10.5 K/uL 7.5 7.0 -  Hemoglobin 12.0 - 15.0 g/dL 7.5(L) 7.9(L) -  Hematocrit 36.0 - 46.0 % 22.6(L) 23.6(L) 24.0(L)  Platelets 150 - 400 K/uL 23(LL) 26(LL) -    . CMP Latest Ref Rng & Units 07/11/2017 07/10/2017 12/26/2016  Glucose 65 - 99 mg/dL 99 107(H) 112(H)  BUN 6 - 20 mg/dL '17 18 13  ' Creatinine 0.44 - 1.00 mg/dL 0.74 0.83 0.78  Sodium 135 - 145 mmol/L 139 139 141  Potassium 3.5 - 5.1 mmol/L 3.8 3.9 4.4  Chloride 101 - 111 mmol/L 109 104 101  CO2 22 - 32 mmol/L '24 23 26  ' Calcium 8.9 - 10.3 mg/dL 8.3(L) 9.2 9.6  Total Protein 6.5 - 8.1 g/dL - 6.8 -  Total Bilirubin 0.3 - 1.2 mg/dL - 1.0 -  Alkaline Phos 38 - 126 U/L - 42 -  AST 15 - 41 U/L - 22 -  ALT 14 - 54 U/L - 26 -   07/11/17 BM Bx:    RADIOGRAPHIC STUDIES: I have personally reviewed the radiological images as listed and agreed with the findings in the report. Dg Chest 2 View  Result Date: 07/10/2017 CLINICAL DATA:  Shortness of breath beginning several weeks ago. Low back pain. EXAM: CHEST - 2 VIEW COMPARISON:  None. FINDINGS: Heart size is normal. Dual lead pacemaker is in place. The leads are positioned in the right ventricle and right atrium. The lungs are clear. There is no edema or effusion. Surgical clips are present over the right upper quadrant. IMPRESSION: No active cardiopulmonary disease. Electronically Signed   By: San Morelle M.D.   On: 07/10/2017 12:27   Ct Head Wo Contrast  Result Date: 07/10/2017 CLINICAL DATA:  84 year old female with headache. Low platelets.  Initial encounter. EXAM: CT HEAD WITHOUT CONTRAST TECHNIQUE: Contiguous axial images were obtained from the base of the skull through the vertex without intravenous contrast. COMPARISON:  12/03/2015 CT. FINDINGS: Brain: No intracranial hemorrhage or CT evidence of large acute infarct. Mild chronic microvascular changes. No intracranial mass lesion noted on this unenhanced exam. Vascular: Vascular calcifications. Skull: Negative Sinuses/Orbits: No acute orbital abnormality. Visualized paranasal sinuses are clear. Other: Mastoid air cells and middle ear cavities are clear. IMPRESSION: No acute intracranial abnormality. Chronic microvascular changes. Electronically Signed   By: Genia Del M.D.   On: 07/10/2017 18:33   Ct Bone Marrow Biopsy  Result Date: 07/11/2017 INDICATION: ANEMIA, THROMBOCYTOPENIA EXAM: CT GUIDED RIGHT ILIAC BONE MARROW ASPIRATION AND CORE BIOPSY Date:  07/11/2017 07/11/2017 10:26 am Radiologist:  M. Daryll Brod, MD Guidance:  CT FLUOROSCOPY TIME:  Fluoroscopy Time: None. MEDICATIONS: None. ANESTHESIA/SEDATION: 1.0 mg IV Versed; 100 mcg IV Fentanyl Moderate Sedation Time:  10 minutes The patient was continuously monitored during the procedure by the interventional radiology nurse under my direct supervision. CONTRAST:  None. COMPLICATIONS: None PROCEDURE: Informed consent was obtained from the patient following explanation of the procedure, risks, benefits and alternatives. The patient understands, agrees and consents for the procedure. All questions were addressed. A time out was performed. The patient was positioned prone and non-contrast localization CT was performed of the pelvis to demonstrate the iliac marrow spaces. Maximal barrier sterile technique utilized including caps, mask, sterile gowns, sterile gloves, large sterile drape, hand hygiene, and Betadine prep. Under sterile conditions and local anesthesia, an 11 gauge coaxial bone biopsy needle was advanced into the right iliac marrow  space. Needle position was confirmed with CT imaging. Initially, bone marrow aspiration was performed. Next, the 11 gauge  outer cannula was utilized to obtain a right iliac bone marrow core biopsy. Needle was removed. Hemostasis was obtained with compression. The patient tolerated the procedure well. Samples were prepared with the cytotechnologist. No immediate complications. IMPRESSION: CT guided right iliac bone marrow aspiration and core biopsy. Electronically Signed   By: Jerilynn Mages.  Shick M.D.   On: 07/11/2017 11:23    ASSESSMENT & PLAN:    84 y.o. female with  1. 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 No overt evidence of background MDS on BM Bx 2. Pancytopenia related to AML. PLAN -I discussed in details the bone marrow bx and other labs results and her new concerning diagnosis of AML, natural history, elements of evaluation, concerning overall prognosis, treatment options which are typically driven by genetics of the tumor but also largely determined by age and medical status. Patient and husband were given patient education material to read as well. -I discussed that though typically her age might be a disqualifier for aggressive induction chemnotherapy --especially if she has complex cytogenetics- I would recommend transfering patient to the [facility] inpatient leukemia service for further workup and treatment consideration especially since it's possible she may have Inv 16 mutation (pending FISH/Mol Cy) and with reasonable performance might be a candidate for a truncated induction chemotherapy regimen with modifications. -if see is deemed not to be a candidate for induction chemorx then options would be supportive care vs outpatient VIdaza or decitabine. -Discussed Vidaza as an outpatient regimen supported by prn transfusions that the pt could alternatively consider, as a less aggressive approach.  -The pt and her husband note  that they would like to see if [facility] is able to take her transfer of care at this time.  -Discussed that the patient's age would be prohibitive for a transplant. -Provided supplemental information to the pt and her husband and answered all their other questions. -would recommend transfer to [facility] inpatient leukemia service - communicated with hospital medicine regarding this.  -supportive transfusion with leucocyte reduced and irradiated blood products only  -prbc for hgb<8 -platelets<10k prophylactically or if bleeding  I spent 35 minutes counseling the patient face to face. The total time spent in the appointment was 40 minutes and more than 50% was on counseling and direct patient cares.    Sullivan Lone MD MS AAHIVMS [facility] Hematology/Oncology Physician [facility]  (Office):       [phone] (Work cell):  [phone] (Fax):           [phone]  07/12/2017 4:32 PM  This document serves as a record of services personally performed by Sullivan Lone, MD. It was created on his behalf by Baldwin Jamaica, a trained medical scribe. The creation of this record is based on the scribe's personal observations and the provider's statements to them.   .I have reviewed the above documentation for accuracy and completeness, and I agree with the above. Sullivan Lone MD MS

## 2017-07-12 NOTE — Progress Notes (Signed)
PROGRESS NOTE    Jamie Reese   TRZ:[id number]  DOB: [birth year]  DOA: 07/10/2017 PCP: Prince Solian, MD   Brief Narrative:  Jamie Reese 84 y.o. female with medical history significant for complete heart block status post pacer placement, coronary artery disease with stent, and arthritis using tramadol, now presenting to the emergency department with about 3 weeks of general malaise, exertional dyspnea, and abnormal CBC.  Patient reports that she noted the insidious development of exertional dyspnea and nonspecific malaise approximately 3 weeks ago, saw her PCP at that time, was suspected to have a viral URI, but reports that her exertional dyspnea and malaise have persisted.  She reports seeing her PCP for a annual physical, had blood work obtained as part of this, was noted to have a new anemia and marked thrombocytopenia, and was directed to the ED for further evaluation.  She denies any rash, denies melena, denies hematochezia, denies epistaxis or bleeding from her gums, and denies any easy bruising or bleeding. ED labwork: White count 6.7, hemoglobin 8.7, platelets 29 Bone marrow biopsy ordered and hematology consulted  Subjective: Pt complaining of dull headache after not being able to sleep well last night.   Assessment & Plan:   Principal Problem:    Macrocytic anemia/Thrombocytopenia / neutropenia   Slightly Elevated PT/ PTT and D dimer - Hb 13.5 in 9/18- 8.7 on last check at 7.5. Will place order to transfuse given history of heart disease and prior stent in past.  - Plt 215 in 9/18, 29 on admission and about the same at 23 today - smear>> Neutropenia with few atypical lymphocytes , no schistocytes  - add neutropenic precautions - avoid anticoagulation and NSAIDs -  Anemia panel reviewed  - had BM biopsy awaiting results -  Numerous labs pending- oncology to see later today  Active Problems:   Coronary atherosclerosis with stent - aspirin stopped    Complete heart  block  - pacer  DVT prophylaxis: SCDs Code Status: Full code Family Communication: d/c patient and family member at bedside.  Disposition Plan: home Consultants:   oncology Procedures:   Bone marrow biopsy Antimicrobials:  Anti-infectives (From admission, onward)   None       Objective: Vitals:   07/12/17 1159 07/12/17 1207 07/12/17 1443 07/12/17 1515  BP: (!) 138/49 (!) 129/58 (!) 130/47 (!) 134/52  Pulse: 77  70 70  Resp: '14  14 18  ' Temp: 98.5 F (36.9 C) 98.5 F (36.9 C) 98.5 F (36.9 C) 98.6 F (37 C)  TempSrc: Oral Oral Oral Oral  SpO2: 98%  97% 97%  Weight:      Height:        Intake/Output Summary (Last 24 hours) at 07/12/2017 1605 Last data filed at 07/12/2017 1515 Gross per 24 hour  Intake 1487 ml  Output -  Net 1487 ml   Filed Weights   07/10/17 1116 07/10/17 2237 07/12/17 0500  Weight: 59.4 kg (131 lb) 59.4 kg (131 lb) 58.7 kg (129 lb 8 oz)    Examination: General exam: Appears comfortable, in nad.  HEENT: PERRLA, oral mucosa moist, no sclera icterus or thrush Respiratory system: Clear to auscultation. Respiratory effort normal. Cardiovascular system: S1 & S2 heard, RRR.   Gastrointestinal system: Abdomen soft, non-tender, nondistended. Normal bowel sound. No organomegaly Central nervous system: Alert and oriented. No focal neurological deficits. Extremities: No cyanosis, clubbing or edema Skin: No rashes or ulcers, on limited exam.  Psychiatry:  Mood & affect appropriate.  Data Reviewed: I have personally reviewed following labs and imaging studies  CBC: Recent Labs  Lab 07/10/17 1130 07/10/17 1848 07/10/17 2219 07/11/17 0244 07/12/17 0228  WBC 6.7  --   --  7.0 7.5  NEUTROABS 0.1*  --   --   --  0.1*  HGB 8.7*  --   --  7.9* 7.5*  HCT 26.2*  --  24.0* 23.6* 22.6*  MCV 104.8*  --   --  105.4* 105.6*  PLT 29* 26*  --  26* 23*   Basic Metabolic Panel: Recent Labs  Lab 07/10/17 1130 07/11/17 0244  NA 139 139  K 3.9 3.8  CL  104 109  CO2 23 24  GLUCOSE 107* 99  BUN 18 17  CREATININE 0.83 0.74  CALCIUM 9.2 8.3*   GFR: Estimated Creatinine Clearance: 44.7 mL/min (by C-G formula based on SCr of 0.74 mg/dL). Liver Function Tests: Recent Labs  Lab 07/10/17 1130  AST 22  ALT 26  ALKPHOS 42  BILITOT 1.0  PROT 6.8  ALBUMIN 3.7   No results for input(s): LIPASE, AMYLASE in the last 168 hours. No results for input(s): AMMONIA in the last 168 hours. Coagulation Profile: Recent Labs  Lab 07/10/17 1130 07/10/17 1848  INR 1.13 1.26   Cardiac Enzymes: No results for input(s): CKTOTAL, CKMB, CKMBINDEX, TROPONINI in the last 168 hours. BNP (last 3 results) No results for input(s): PROBNP in the last 8760 hours. HbA1C: No results for input(s): HGBA1C in the last 72 hours. CBG: No results for input(s): GLUCAP in the last 168 hours. Lipid Profile: No results for input(s): CHOL, HDL, LDLCALC, TRIG, CHOLHDL, LDLDIRECT in the last 72 hours. Thyroid Function Tests: No results for input(s): TSH, T4TOTAL, FREET4, T3FREE, THYROIDAB in the last 72 hours. Anemia Panel: Recent Labs    07/10/17 1630 07/10/17 1848  VITAMINB12 1,083*  --   TIBC 225*  --   IRON 108  --   RETICCTPCT  --  1.1   Urine analysis: No results found for: COLORURINE, APPEARANCEUR, LABSPEC, PHURINE, GLUCOSEU, HGBUR, BILIRUBINUR, KETONESUR, PROTEINUR, UROBILINOGEN, NITRITE, LEUKOCYTESUR Sepsis Labs: '@LABRCNTIP' (procalcitonin:4,lacticidven:4) )No results found for this or any previous visit (from the past 240 hour(s)).       Radiology Studies: Ct Head Wo Contrast  Result Date: 07/10/2017 CLINICAL DATA:  84 year old female with headache. Low platelets. Initial encounter. EXAM: CT HEAD WITHOUT CONTRAST TECHNIQUE: Contiguous axial images were obtained from the base of the skull through the vertex without intravenous contrast. COMPARISON:  12/03/2015 CT. FINDINGS: Brain: No intracranial hemorrhage or CT evidence of large acute infarct. Mild  chronic microvascular changes. No intracranial mass lesion noted on this unenhanced exam. Vascular: Vascular calcifications. Skull: Negative Sinuses/Orbits: No acute orbital abnormality. Visualized paranasal sinuses are clear. Other: Mastoid air cells and middle ear cavities are clear. IMPRESSION: No acute intracranial abnormality. Chronic microvascular changes. Electronically Signed   By: Genia Del M.D.   On: 07/10/2017 18:33   Ct Bone Marrow Biopsy  Result Date: 07/11/2017 INDICATION: ANEMIA, THROMBOCYTOPENIA EXAM: CT GUIDED RIGHT ILIAC BONE MARROW ASPIRATION AND CORE BIOPSY Date:  07/11/2017 07/11/2017 10:26 am Radiologist:  M. Daryll Brod, MD Guidance:  CT FLUOROSCOPY TIME:  Fluoroscopy Time: None. MEDICATIONS: None. ANESTHESIA/SEDATION: 1.0 mg IV Versed; 100 mcg IV Fentanyl Moderate Sedation Time:  10 minutes The patient was continuously monitored during the procedure by the interventional radiology nurse under my direct supervision. CONTRAST:  None. COMPLICATIONS: None PROCEDURE: Informed consent was obtained from the patient following explanation of the  procedure, risks, benefits and alternatives. The patient understands, agrees and consents for the procedure. All questions were addressed. A time out was performed. The patient was positioned prone and non-contrast localization CT was performed of the pelvis to demonstrate the iliac marrow spaces. Maximal barrier sterile technique utilized including caps, mask, sterile gowns, sterile gloves, large sterile drape, hand hygiene, and Betadine prep. Under sterile conditions and local anesthesia, an 11 gauge coaxial bone biopsy needle was advanced into the right iliac marrow space. Needle position was confirmed with CT imaging. Initially, bone marrow aspiration was performed. Next, the 11 gauge outer cannula was utilized to obtain a right iliac bone marrow core biopsy. Needle was removed. Hemostasis was obtained with compression. The patient tolerated the  procedure well. Samples were prepared with the cytotechnologist. No immediate complications. IMPRESSION: CT guided right iliac bone marrow aspiration and core biopsy. Electronically Signed   By: Jerilynn Mages.  Shick M.D.   On: 07/11/2017 11:23    Scheduled Meds: . atorvastatin  20 mg Oral q1800  . B-complex with vitamin C  1 tablet Oral Daily  . sodium chloride flush  3 mL Intravenous Q12H   Continuous Infusions:   LOS: 2 days    Time spent in minutes: 35  minutes  Velvet Bathe, MD [facility] Pager: [email or web address] Password [email or web address] 07/12/2017, 4:05 PM

## 2017-07-13 DIAGNOSIS — Z955 Presence of coronary angioplasty implant and graft: Secondary | ICD-10-CM | POA: Diagnosis not present

## 2017-07-13 DIAGNOSIS — I313 Pericardial effusion (noninflammatory): Secondary | ICD-10-CM | POA: Diagnosis not present

## 2017-07-13 DIAGNOSIS — M353 Polymyalgia rheumatica: Secondary | ICD-10-CM | POA: Diagnosis not present

## 2017-07-13 DIAGNOSIS — E785 Hyperlipidemia, unspecified: Secondary | ICD-10-CM | POA: Diagnosis present

## 2017-07-13 DIAGNOSIS — R001 Bradycardia, unspecified: Secondary | ICD-10-CM | POA: Diagnosis not present

## 2017-07-13 DIAGNOSIS — I442 Atrioventricular block, complete: Secondary | ICD-10-CM | POA: Diagnosis not present

## 2017-07-13 DIAGNOSIS — D696 Thrombocytopenia, unspecified: Secondary | ICD-10-CM | POA: Diagnosis present

## 2017-07-13 DIAGNOSIS — C9202 Acute myeloblastic leukemia, in relapse: Secondary | ICD-10-CM | POA: Diagnosis not present

## 2017-07-13 DIAGNOSIS — K589 Irritable bowel syndrome without diarrhea: Secondary | ICD-10-CM | POA: Diagnosis not present

## 2017-07-13 DIAGNOSIS — R5081 Fever presenting with conditions classified elsewhere: Secondary | ICD-10-CM | POA: Diagnosis not present

## 2017-07-13 DIAGNOSIS — D709 Neutropenia, unspecified: Secondary | ICD-10-CM | POA: Diagnosis not present

## 2017-07-13 DIAGNOSIS — M316 Other giant cell arteritis: Secondary | ICD-10-CM | POA: Diagnosis present

## 2017-07-13 DIAGNOSIS — R0600 Dyspnea, unspecified: Secondary | ICD-10-CM | POA: Diagnosis not present

## 2017-07-13 DIAGNOSIS — Z950 Presence of cardiac pacemaker: Secondary | ICD-10-CM | POA: Diagnosis not present

## 2017-07-13 DIAGNOSIS — Z9861 Coronary angioplasty status: Secondary | ICD-10-CM | POA: Diagnosis not present

## 2017-07-13 DIAGNOSIS — R5383 Other fatigue: Secondary | ICD-10-CM | POA: Diagnosis not present

## 2017-07-13 DIAGNOSIS — C9200 Acute myeloblastic leukemia, not having achieved remission: Secondary | ICD-10-CM

## 2017-07-13 DIAGNOSIS — D61810 Antineoplastic chemotherapy induced pancytopenia: Secondary | ICD-10-CM | POA: Diagnosis not present

## 2017-07-13 DIAGNOSIS — I2510 Atherosclerotic heart disease of native coronary artery without angina pectoris: Secondary | ICD-10-CM | POA: Diagnosis present

## 2017-07-13 DIAGNOSIS — R000 Tachycardia, unspecified: Secondary | ICD-10-CM | POA: Diagnosis not present

## 2017-07-13 DIAGNOSIS — T451X5A Adverse effect of antineoplastic and immunosuppressive drugs, initial encounter: Secondary | ICD-10-CM

## 2017-07-13 DIAGNOSIS — Z7189 Other specified counseling: Secondary | ICD-10-CM

## 2017-07-13 DIAGNOSIS — I351 Nonrheumatic aortic (valve) insufficiency: Secondary | ICD-10-CM | POA: Diagnosis not present

## 2017-07-13 DIAGNOSIS — R079 Chest pain, unspecified: Secondary | ICD-10-CM | POA: Diagnosis not present

## 2017-07-13 DIAGNOSIS — K648 Other hemorrhoids: Secondary | ICD-10-CM | POA: Diagnosis not present

## 2017-07-13 DIAGNOSIS — I10 Essential (primary) hypertension: Secondary | ICD-10-CM | POA: Diagnosis present

## 2017-07-13 DIAGNOSIS — Z1212 Encounter for screening for malignant neoplasm of rectum: Secondary | ICD-10-CM | POA: Diagnosis not present

## 2017-07-13 DIAGNOSIS — D61818 Other pancytopenia: Secondary | ICD-10-CM | POA: Diagnosis not present

## 2017-07-13 LAB — BPAM RBC
BLOOD PRODUCT EXPIRATION DATE: 201904152359
ISSUE DATE / TIME: 201904101126
UNIT TYPE AND RH: 600

## 2017-07-13 LAB — TYPE AND SCREEN
ABO/RH(D): A POS
Antibody Screen: NEGATIVE
UNIT DIVISION: 0

## 2017-07-13 LAB — HCV RNA QUANT RFLX ULTRA OR GENOTYP
HCV RNA Qnt(log copy/mL): UNDETERMINED log10 IU/mL
HEPATITIS C QUANTITATION: NOT DETECTED [IU]/mL

## 2017-07-13 LAB — CBC
HCT: 29.1 % — ABNORMAL LOW (ref 36.0–46.0)
Hemoglobin: 10 g/dL — ABNORMAL LOW (ref 12.0–15.0)
MCH: 34 pg (ref 26.0–34.0)
MCHC: 34.4 g/dL (ref 30.0–36.0)
MCV: 99 fL (ref 78.0–100.0)
PLATELETS: 20 10*3/uL — AB (ref 150–400)
RBC: 2.94 MIL/uL — ABNORMAL LOW (ref 3.87–5.11)
RDW: 19.8 % — AB (ref 11.5–15.5)
WBC: 8.3 10*3/uL (ref 4.0–10.5)

## 2017-07-13 LAB — EPSTEIN-BARR VIRUS VCA ANTIBODY PANEL
EBV EARLY ANTIGEN AB, IGG: 89.3 U/mL — AB (ref 0.0–8.9)
EBV NA IGG: 25.9 U/mL — AB (ref 0.0–17.9)
EBV VCA IGG: 202 U/mL — AB (ref 0.0–17.9)

## 2017-07-13 LAB — CMV IGM: CMV IgM: <30 AU/mL (ref 0.0–29.9)

## 2017-07-13 LAB — CMV ANTIBODY, IGG (EIA): CMV Ab - IgG: 9.2 U/mL — ABNORMAL HIGH (ref 0.00–0.59)

## 2017-07-13 LAB — PARVOVIRUS B19 ANTIBODY, IGG AND IGM
PAROVIRUS B19 IGM ABS: 0.5 {index} (ref 0.0–0.8)
Parovirus B19 IgG Abs: 0.2 index (ref 0.0–0.8)

## 2017-07-13 LAB — HIV ANTIBODY (ROUTINE TESTING W REFLEX): HIV Screen 4th Generation wRfx: NONREACTIVE

## 2017-07-13 MED ORDER — ATORVASTATIN CALCIUM 20 MG PO TABS: 20.0000 mg | ORAL_TABLET | Freq: Every day | ORAL | Status: DC

## 2017-07-13 MED ORDER — B COMPLEX-C PO TABS: 1.0000 | ORAL_TABLET | Freq: Every day | ORAL | Status: AC

## 2017-07-13 NOTE — Consult Note (Signed)
[facility] CM Primary Care Navigator  07/13/2017  Jamie Reese 1933/06/26 [id number]  Met withpatient at the bedsideto identify possible discharge needs. Patientreports having"persistent shortness of breath, headaches, sore throat, hoarseness and low blood count" thatresultedto this admission and requiring blood transfusion (Macrocytic anemia/Thrombocytopenia / neutropenia).  Patientend[facility].   [facility] [place] [facility].   Patient [facility]managingherownmedications [facility] of "[facility]" system filled once a week.  Patient statesthatshe drives prior to admission,but husband (Max) can providetransportation to [place] discharge.  Patient verbalizedthat she lives with her husband who will serve as the primary caregiver at home. She mentioned that they can afford to hire a caregiver to be paid through her long term insurance whenever needed.  Anticipateddischarge planishomeaccording to patient. She reports that there might be a possibility of her being transferred from here to [facility] for leukemia work-up.  Patientvoiced understanding to call primary care provider's officewhen shereturnsback homefor a post discharge follow-up within1- 2weeksor sooner if needs arise.Patient letter (with PCP's contact number) was provided asareminder.   Discussed with patientregarding [facility] CM services available for health management at home butshe states managing well and "does not really feel needing any services" at this point. [place] calls to follow-upwith recoveryas well, stating that she has the support that she needs at home. Encouraged patientto seekreferral from primary care provider to [facility] care management if deemednecessaryand  appropriate for services in the future.  [facility] care management information was provided for future needs thatshe may have.   For additional questions please contact:  Edwena Felty A. Kelwin Gibler, BSN, RN-BC [facility] PRIMARY CARE Navigator Cell: [phone]

## 2017-07-13 NOTE — Progress Notes (Signed)
Pt has a bed at the [place] [facility]. Report called to receiving Archivist. Transportation set up via [employer]. Pt and husband agreeable to plan. Will continue to monitor.

## 2017-07-13 NOTE — Discharge Summary (Signed)
Physician Discharge Summary  Jamie Reese ZJI:[id number] DOB: 11/21/1933 DOA: 07/10/2017  PCP: Prince Solian, MD  Admit date: 07/10/2017 Discharge date: 07/13/2017  Time spent:  35 minutes  Recommendations for Outpatient Follow-up:  1. Monitor hgb and platelet levels   Discharge Diagnoses:  Principal Problem:   Thrombocytopenia ([place]) Active Problems:   Coronary atherosclerosis   Complete heart block (HCC)   Macrocytic anemia   Neutropenia (HCC)   Acute myeloid leukemia not having achieved remission (HCC)   Antineoplastic chemotherapy induced pancytopenia (HCC)   Counseling regarding advance care planning and goals of care   Discharge Condition: stable  Diet recommendation: regular diet  Filed Weights   07/10/17 1116 07/10/17 2237 07/12/17 0500  Weight: 59.4 kg (131 lb) 59.4 kg (131 lb) 58.7 kg (129 lb 8 oz)    History of present illness:  84 y.o.femalewith medical history significant forcomplete heart block status post pacer placement, coronary artery disease with stent, and arthritis using tramadol, now presenting to the emergency department with about 3 weeks of general malaise, exertional dyspnea, and abnormal CBC.  Pt had bone marrow bx and oncologist diagnosed with new onset AML  Hospital Course:  Pancytopenia related to AML/Newly dx Acute Myeloid Leukemia - evaluated by our oncologist and pt is s/p bone marrow bx. Recommendations were to transition to inpatient leukemia service at [facility] - transition to inpatient leukemia service at [facility] for further evaluation and recommendations - discussed with Dr. Linus Orn (accepting physician)   Procedures:  none  Consultations:  Oncology  Discharge Exam: Vitals:   07/12/17 2040 07/13/17 0548  BP: (!) 137/49 (!) 138/50  Pulse: 90 77  Resp: 17 13  Temp: 98.2 F (36.8 C) 98.1 F (36.7 C)  SpO2: 99% 97%    General: Pt in nad, alert and awake Cardiovascular: rrr, no rubs Respiratory: no increased wob,  no wheezes  Discharge Instructions   Discharge Instructions    Call MD for:  extreme fatigue   Complete by:  As directed    Call MD for:  temperature >100.4   Complete by:  As directed    Diet - low sodium heart healthy   Complete by:  As directed    Increase activity slowly   Complete by:  As directed      Allergies as of 07/13/2017      Reactions   Hydrocodone Shortness Of Breath, Other (See Comments)   SOB, passed out feeling, nausea    Codeine Nausea And Vomiting, Other (See Comments)   Also passed out   Cymbalta [duloxetine Hcl] Nausea And Vomiting, Other (See Comments)   Also passed out   Lyrica [pregabalin] Nausea And Vomiting      Medication List    STOP taking these medications   ALIGN PO   aspirin EC 81 MG tablet   beta carotene w/minerals tablet   ergocalciferol 50000 units capsule Commonly known as:  VITAMIN D2   multivitamin with minerals Tabs tablet   traMADol 50 MG tablet Commonly known as:  ULTRAM     TAKE these medications   atorvastatin 20 MG tablet Commonly known as:  LIPITOR Take 1 tablet (20 mg total) by mouth daily at 6 PM.   B-complex with vitamin C tablet Take 1 tablet by mouth daily. Start taking on:  07/14/2017      Allergies  Allergen Reactions  . Hydrocodone Shortness Of Breath and Other (See Comments)    SOB, passed out feeling, nausea   . Codeine Nausea And  Vomiting and Other (See Comments)    Also passed out  . Cymbalta [Duloxetine Hcl] Nausea And Vomiting and Other (See Comments)    Also passed out  . Lyrica [Pregabalin] Nausea And Vomiting      The results of significant diagnostics from this hospitalization (including imaging, microbiology, ancillary and laboratory) are listed below for reference.    Significant Diagnostic Studies: Dg Chest 2 View  Result Date: 07/10/2017 CLINICAL DATA:  Shortness of breath beginning several weeks ago. Low back pain. EXAM: CHEST - 2 VIEW COMPARISON:  None. FINDINGS: Heart size is  normal. Dual lead pacemaker is in place. The leads are positioned in the right ventricle and right atrium. The lungs are clear. There is no edema or effusion. Surgical clips are present over the right upper quadrant. IMPRESSION: No active cardiopulmonary disease. Electronically Signed   By: San Morelle M.D.   On: 07/10/2017 12:27   Ct Head Wo Contrast  Result Date: 07/10/2017 CLINICAL DATA:  84 year old female with headache. Low platelets. Initial encounter. EXAM: CT HEAD WITHOUT CONTRAST TECHNIQUE: Contiguous axial images were obtained from the base of the skull through the vertex without intravenous contrast. COMPARISON:  12/03/2015 CT. FINDINGS: Brain: No intracranial hemorrhage or CT evidence of large acute infarct. Mild chronic microvascular changes. No intracranial mass lesion noted on this unenhanced exam. Vascular: Vascular calcifications. Skull: Negative Sinuses/Orbits: No acute orbital abnormality. Visualized paranasal sinuses are clear. Other: Mastoid air cells and middle ear cavities are clear. IMPRESSION: No acute intracranial abnormality. Chronic microvascular changes. Electronically Signed   By: Genia Del M.D.   On: 07/10/2017 18:33   Ct Bone Marrow Biopsy  Result Date: 07/11/2017 INDICATION: ANEMIA, THROMBOCYTOPENIA EXAM: CT GUIDED RIGHT ILIAC BONE MARROW ASPIRATION AND CORE BIOPSY Date:  07/11/2017 07/11/2017 10:26 am Radiologist:  M. Daryll Brod, MD Guidance:  CT FLUOROSCOPY TIME:  Fluoroscopy Time: None. MEDICATIONS: None. ANESTHESIA/SEDATION: 1.0 mg IV Versed; 100 mcg IV Fentanyl Moderate Sedation Time:  10 minutes The patient was continuously monitored during the procedure by the interventional radiology nurse under my direct supervision. CONTRAST:  None. COMPLICATIONS: None PROCEDURE: Informed consent was obtained from the patient following explanation of the procedure, risks, benefits and alternatives. The patient understands, agrees and consents for the procedure. All  questions were addressed. A time out was performed. The patient was positioned prone and non-contrast localization CT was performed of the pelvis to demonstrate the iliac marrow spaces. Maximal barrier sterile technique utilized including caps, mask, sterile gowns, sterile gloves, large sterile drape, hand hygiene, and Betadine prep. Under sterile conditions and local anesthesia, an 11 gauge coaxial bone biopsy needle was advanced into the right iliac marrow space. Needle position was confirmed with CT imaging. Initially, bone marrow aspiration was performed. Next, the 11 gauge outer cannula was utilized to obtain a right iliac bone marrow core biopsy. Needle was removed. Hemostasis was obtained with compression. The patient tolerated the procedure well. Samples were prepared with the cytotechnologist. No immediate complications. IMPRESSION: CT guided right iliac bone marrow aspiration and core biopsy. Electronically Signed   By: Jerilynn Mages.  Shick M.D.   On: 07/11/2017 11:23    Microbiology: No results found for this or any previous visit (from the past 240 hour(s)).   Labs: Basic Metabolic Panel: Recent Labs  Lab 07/10/17 1130 07/11/17 0244  NA 139 139  K 3.9 3.8  CL 104 109  CO2 23 24  GLUCOSE 107* 99  BUN 18 17  CREATININE 0.83 0.74  CALCIUM 9.2 8.3*  Liver Function Tests: Recent Labs  Lab 07/10/17 1130  AST 22  ALT 26  ALKPHOS 42  BILITOT 1.0  PROT 6.8  ALBUMIN 3.7   No results for input(s): LIPASE, AMYLASE in the last 168 hours. No results for input(s): AMMONIA in the last 168 hours. CBC: Recent Labs  Lab 07/10/17 1130 07/10/17 1848 07/10/17 2219 07/11/17 0244 07/12/17 0228  WBC 6.7  --   --  7.0 7.5  NEUTROABS 0.1*  --   --   --  0.1*  HGB 8.7*  --   --  7.9* 7.5*  HCT 26.2*  --  24.0* 23.6* 22.6*  MCV 104.8*  --   --  105.4* 105.6*  PLT 29* 26*  --  26* 23*   Cardiac Enzymes: No results for input(s): CKTOTAL, CKMB, CKMBINDEX, TROPONINI in the last 168  hours. BNP: BNP (last 3 results) No results for input(s): BNP in the last 8760 hours.  ProBNP (last 3 results) No results for input(s): PROBNP in the last 8760 hours.  CBG: No results for input(s): GLUCAP in the last 168 hours.     Signed:  Velvet Bathe MD.  [facility] 07/13/2017, 11:54 AM

## 2017-07-13 NOTE — Care Management Note (Signed)
Case Management Note Marvetta Gibbons RN, BSN Unit 4E-Case Manager [phone]  Patient Details  Name: Jamie Reese MRN: [id number] Date of Birth: 05-14-1933  Subjective/Objective:  Pt admitted with SOB, thrombocytopenia, s/p bone marrow bx-                  Action/Plan: PTA pt lived at home with spouse- CM to follow for transition of care needs  Expected Discharge Date:  07/13/17               Expected Discharge Plan:  Acute to Acute Transfer  In-House Referral:  NA  Discharge planning Services  CM Consult  Post Acute Care Choice:  NA Choice offered to:  NA  DME Arranged:    DME Agency:     HH Arranged:    [place] Agency:     Status of Service:  Completed, signed off  If discussed at [place] of Stay Meetings, dates discussed:    Discharge Disposition: acute to acute transfer   Additional Comments:  07/13/17- 1400- Atlee Kluth RN, CM- per bx results- pt with newly dx AML- recommendation per oncology to tx to [facility]- Dr. Wendee Beavers has made request to [facility] and pt has been accepted by Dr. Linus Orn at [facility], per bedside RN [facility]. [place] form has been completed- [facility] has been called to provide transport.   Dawayne Patricia, RN 07/13/2017, 2:23 PM

## 2017-07-17 LAB — MULTIPLE MYELOMA PANEL, SERUM
ALBUMIN SERPL ELPH-MCNC: 3 g/dL (ref 2.9–4.4)
Albumin/Glob SerPl: 1.4 (ref 0.7–1.7)
Alpha 1: 0.2 g/dL (ref 0.0–0.4)
Alpha2 Glob SerPl Elph-Mcnc: 0.6 g/dL (ref 0.4–1.0)
B-Globulin SerPl Elph-Mcnc: 0.8 g/dL (ref 0.7–1.3)
GAMMA GLOB SERPL ELPH-MCNC: 0.7 g/dL (ref 0.4–1.8)
GLOBULIN, TOTAL: 2.3 g/dL (ref 2.2–3.9)
IgA: 322 mg/dL (ref 64–422)
IgG (Immunoglobin G), Serum: 753 mg/dL (ref 700–1600)
IgM (Immunoglobulin M), Srm: 81 mg/dL (ref 26–217)
Total Protein ELP: 5.3 g/dL — ABNORMAL LOW (ref 6.0–8.5)

## 2017-07-21 ENCOUNTER — Encounter (HOSPITAL_COMMUNITY): Payer: Self-pay | Admitting: Hematology

## 2017-07-27 LAB — TISSUE HYBRIDIZATION (BONE MARROW)-NCBH

## 2017-07-27 LAB — CHROMOSOME ANALYSIS, BONE MARROW

## 2017-08-03 DIAGNOSIS — I2510 Atherosclerotic heart disease of native coronary artery without angina pectoris: Secondary | ICD-10-CM | POA: Diagnosis not present

## 2017-08-03 DIAGNOSIS — C9201 Acute myeloblastic leukemia, in remission: Secondary | ICD-10-CM | POA: Diagnosis not present

## 2017-08-03 DIAGNOSIS — Z95828 Presence of other vascular implants and grafts: Secondary | ICD-10-CM | POA: Diagnosis not present

## 2017-08-03 DIAGNOSIS — R7989 Other specified abnormal findings of blood chemistry: Secondary | ICD-10-CM | POA: Diagnosis not present

## 2017-08-03 DIAGNOSIS — Z792 Long term (current) use of antibiotics: Secondary | ICD-10-CM | POA: Diagnosis not present

## 2017-08-03 DIAGNOSIS — Z95818 Presence of other cardiac implants and grafts: Secondary | ICD-10-CM | POA: Diagnosis not present

## 2017-08-03 DIAGNOSIS — D709 Neutropenia, unspecified: Secondary | ICD-10-CM | POA: Diagnosis not present

## 2017-08-03 DIAGNOSIS — I119 Hypertensive heart disease without heart failure: Secondary | ICD-10-CM | POA: Diagnosis not present

## 2017-08-03 DIAGNOSIS — C9200 Acute myeloblastic leukemia, not having achieved remission: Secondary | ICD-10-CM | POA: Diagnosis not present

## 2017-08-03 DIAGNOSIS — E785 Hyperlipidemia, unspecified: Secondary | ICD-10-CM | POA: Diagnosis not present

## 2017-08-03 DIAGNOSIS — Z8679 Personal history of other diseases of the circulatory system: Secondary | ICD-10-CM | POA: Diagnosis not present

## 2017-08-03 DIAGNOSIS — Z8719 Personal history of other diseases of the digestive system: Secondary | ICD-10-CM | POA: Diagnosis not present

## 2017-08-03 DIAGNOSIS — Z79899 Other long term (current) drug therapy: Secondary | ICD-10-CM | POA: Diagnosis not present

## 2017-08-03 DIAGNOSIS — I5189 Other ill-defined heart diseases: Secondary | ICD-10-CM | POA: Diagnosis not present

## 2017-08-03 DIAGNOSIS — Z950 Presence of cardiac pacemaker: Secondary | ICD-10-CM | POA: Diagnosis not present

## 2017-08-03 DIAGNOSIS — I442 Atrioventricular block, complete: Secondary | ICD-10-CM | POA: Diagnosis not present

## 2017-08-03 DIAGNOSIS — Z955 Presence of coronary angioplasty implant and graft: Secondary | ICD-10-CM | POA: Diagnosis not present

## 2017-08-10 DIAGNOSIS — C9200 Acute myeloblastic leukemia, not having achieved remission: Secondary | ICD-10-CM | POA: Diagnosis not present

## 2017-08-17 DIAGNOSIS — C9200 Acute myeloblastic leukemia, not having achieved remission: Secondary | ICD-10-CM | POA: Diagnosis not present

## 2017-08-21 DIAGNOSIS — C9200 Acute myeloblastic leukemia, not having achieved remission: Secondary | ICD-10-CM | POA: Diagnosis not present

## 2017-08-21 DIAGNOSIS — Z5111 Encounter for antineoplastic chemotherapy: Secondary | ICD-10-CM | POA: Diagnosis not present

## 2017-08-22 DIAGNOSIS — Z5111 Encounter for antineoplastic chemotherapy: Secondary | ICD-10-CM | POA: Diagnosis not present

## 2017-08-22 DIAGNOSIS — C9200 Acute myeloblastic leukemia, not having achieved remission: Secondary | ICD-10-CM | POA: Diagnosis not present

## 2017-08-23 DIAGNOSIS — C9200 Acute myeloblastic leukemia, not having achieved remission: Secondary | ICD-10-CM | POA: Diagnosis not present

## 2017-08-23 DIAGNOSIS — Z5111 Encounter for antineoplastic chemotherapy: Secondary | ICD-10-CM | POA: Diagnosis not present

## 2017-08-24 DIAGNOSIS — Z5111 Encounter for antineoplastic chemotherapy: Secondary | ICD-10-CM | POA: Diagnosis not present

## 2017-08-24 DIAGNOSIS — C9200 Acute myeloblastic leukemia, not having achieved remission: Secondary | ICD-10-CM | POA: Diagnosis not present

## 2017-08-25 DIAGNOSIS — Z5111 Encounter for antineoplastic chemotherapy: Secondary | ICD-10-CM | POA: Diagnosis not present

## 2017-08-25 DIAGNOSIS — C9200 Acute myeloblastic leukemia, not having achieved remission: Secondary | ICD-10-CM | POA: Diagnosis not present

## 2017-09-03 IMAGING — CT CT CERVICAL SPINE W/ CM
3 of 4 series · 12 of 33 positions shown, 14 images · non-contrast
Comparison: Cervical spine CT 06/15/2010

CLINICAL DATA: Neck and right shoulder pain. Low back pain and left
groin pain.
TECHNIQUE: Contiguous axial images were obtained through the Cervical and
Lumbar spine after the intrathecal infusion of contrast. Coronal and
sagittal reconstructions were obtained of the axial image sets.

[Series 6: cor · coronal · 0.25mm/px · 3 of 59 slices shown]
[im 12/59  bone]
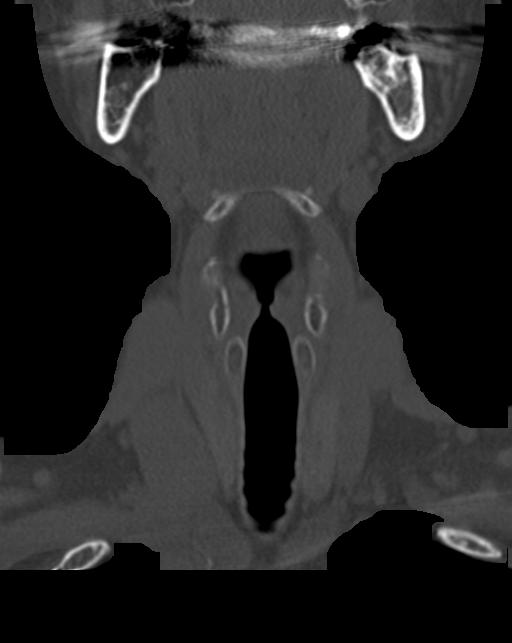
[im 24/59  bone]
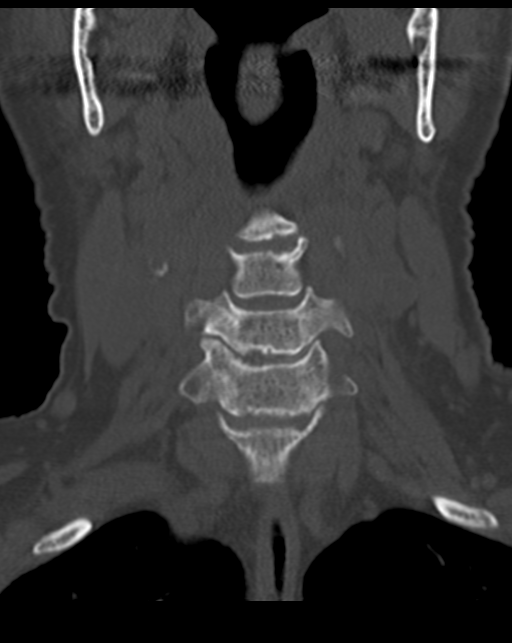
[im 35/59  bone]
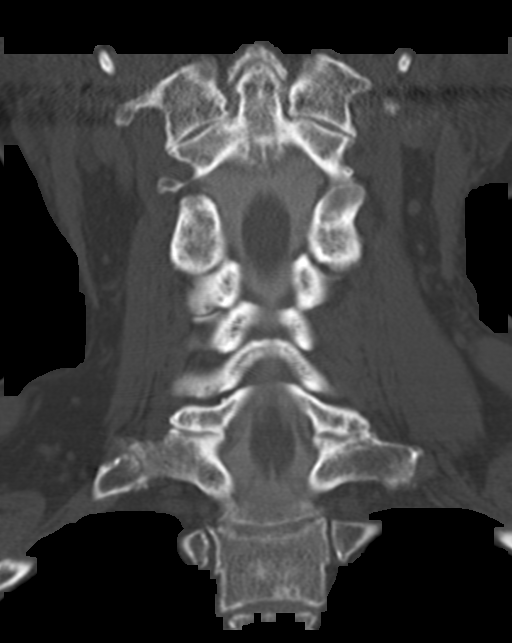

[Series 7: sag · sagittal · 0.26mm/px · 5 of 59 slices shown, 6 images]
[im 20/59  bone]
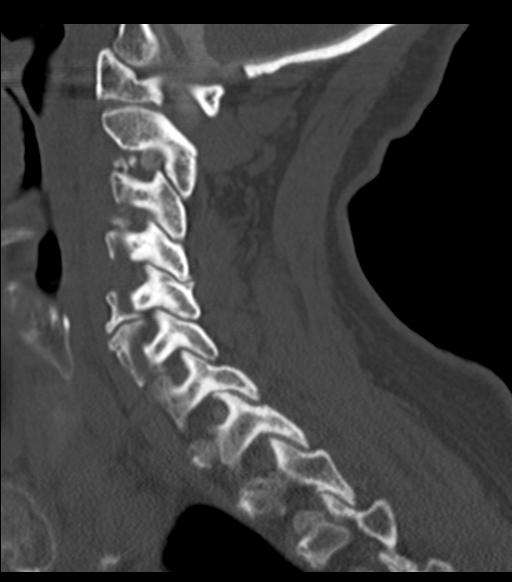
[im 25/59  bone]
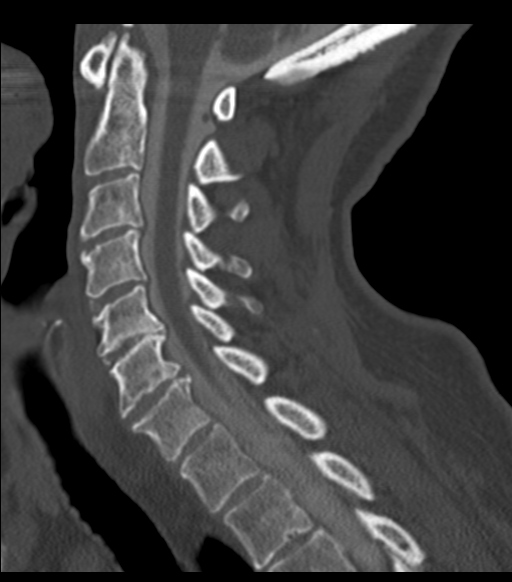
[im 30/59  soft-tissue]
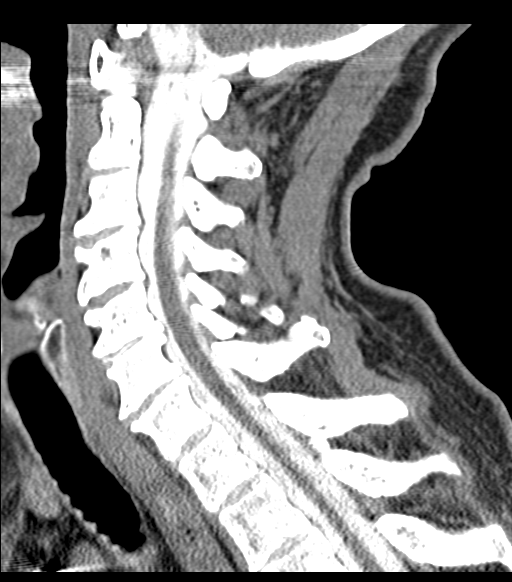
[im 30/59  bone]
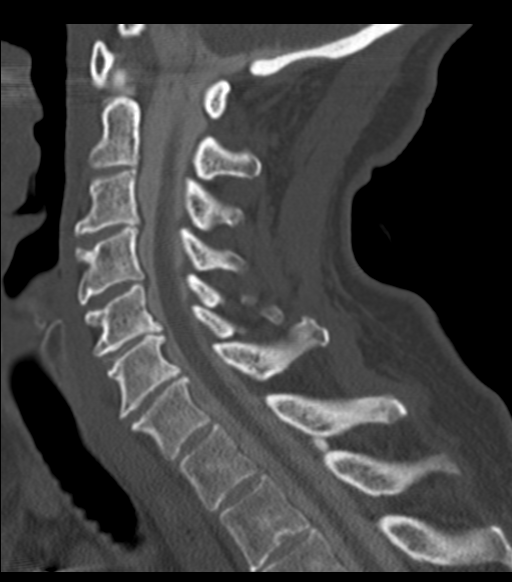
[im 34/59  bone]
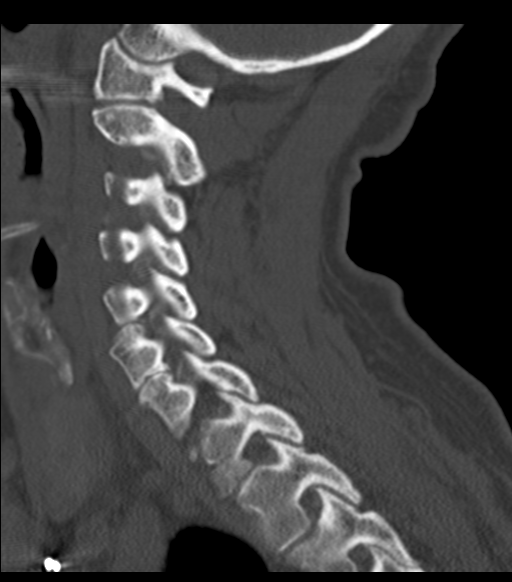
[im 39/59  bone]
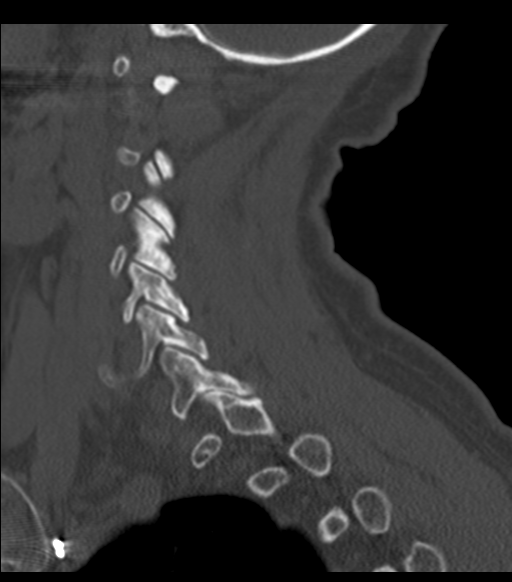

[Series 8: angled axial · axial · 0.29mm/px · z∈[-537,-426]mm · 4 of 82 slices shown, 5 images]
[im 12/82  soft-tissue]
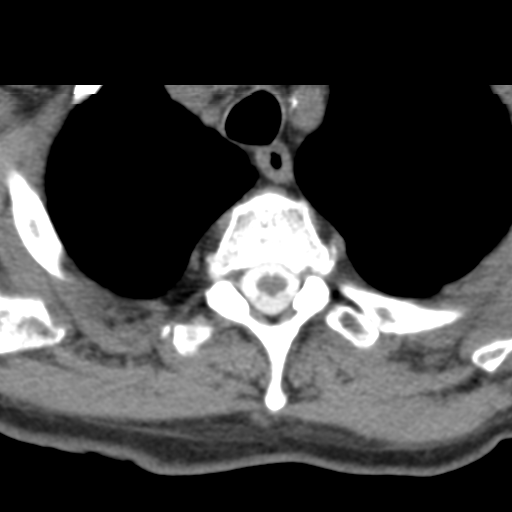
[im 12/82  bone]
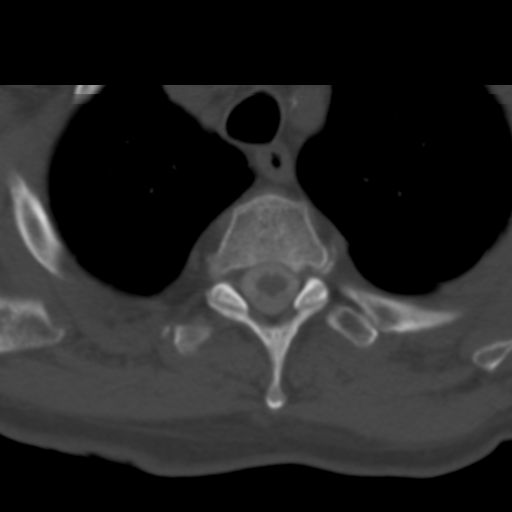
[im 35/82  bone]
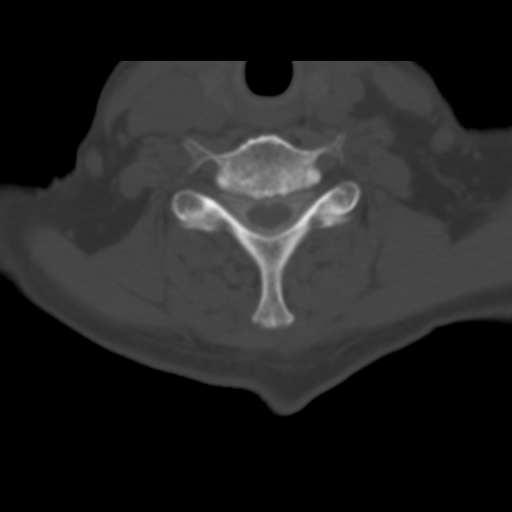
[im 47/82  bone]
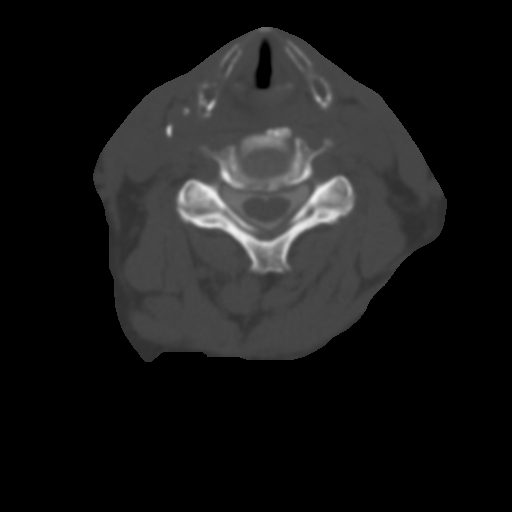
[im 70/82  bone]
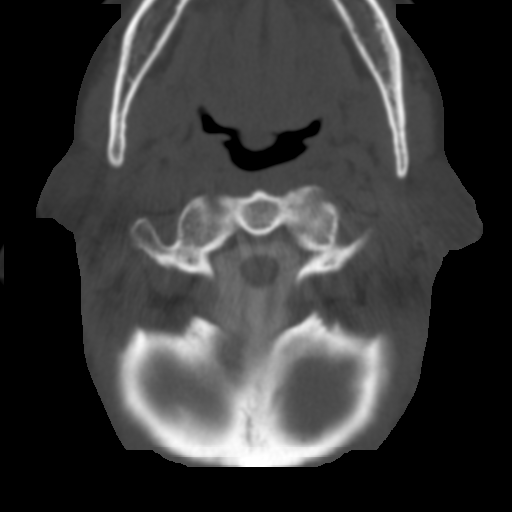

[12 of 33 positions shown; findings below may reference images not displayed]

FLUOROSCOPY TIME:  Fluoroscopy Time (in minutes and seconds): 1
minute 31 seconds

Number of Acquired Images:  21

PROCEDURE:
LUMBAR PUNCTURE FOR CERVICAL AND LUMBAR MYELOGRAM

CERVICAL AND LUMBAR MYELOGRAM

CT CERVICAL MYELOGRAM

CT LUMBAR MYELOGRAM

After thorough discussion of risks and benefits of the procedure
including bleeding, infection, injury to nerves, blood vessels,
adjacent structures as well as headache and CSF leak, written and
oral informed consent was obtained. Consent was obtained by Dr.
Kroyo Salhuteru.

Patient was positioned prone on the fluoroscopy table. Local
anesthesia was provided with 1% lidocaine without epinephrine after
prepped and draped in the usual sterile fashion. Puncture was
performed at L3-4 using a 3 1/2 inch 22-gauge spinal needle via a
right interlaminar approach. Using a single pass through the dura,
the needle was placed within the thecal sac, with return of clear
CSF. 10 mL of [id number]-M 300 was injected into the thecal sac, with
normal opacification of the nerve roots and cauda equina consistent
with free flow within the subarachnoid space. The patient was then
moved to the trendelenburg position and contrast flowed into the
Cervical spine region.

I personally performed the lumbar puncture and administered the
intrathecal contrast. I also personally supervised acquisition of
the myelogram images.
FINDINGS: CERVICAL AND LUMBAR MYELOGRAM FINDINGS:

There is slight retrolisthesis of C5 on C6 without significant
change during flexion or extension. Mild-to-moderate disc space
narrowing and small ventral extradural defects are present from C3-4
to C6-7 with at most mild spinal stenosis, greatest at C5-6 and
C6-7. There is truncation of the right C6 nerve root.

Lumbar vertebral alignment is within normal limits without evidence
of dynamic instability on flexion or extension images. Vertebral
body heights are preserved. Advanced disc space narrowing is present
at L5-S1. There is a large extradural defect on the left at L2-3
affecting the left L3 nerve. There is evidence of milder lateral
recess narrowing on the right at L3-4. Small ventral extradural
defects are seen from L2-3 to L5-S1 on lateral images, most notable
at L4-5 though without evidence of significant spinal stenosis.

CT CERVICAL MYELOGRAM FINDINGS:

There is trace retrolisthesis of C5 on C6. Vertebral body heights
are preserved. There is mild-to-moderate disc space narrowing at
C3-4, C5-6, and C6-7, greatest at C5-6. Mild disc space narrowing is
present at C4-5. Degenerative endplate changes are present at all of
these levels. C3-4 and C4-5 disc degeneration have slightly
progressed from the prior CT. The cervical spinal cord is normal in
caliber. Prominent calcified atherosclerotic plaque is noted in the
proximal right internal carotid artery.

C2-3:  Negative.

C3-4: Broad-based posterior disc osteophyte complex without
significant stenosis.

C4-5: Broad-based posterior disc osteophyte complex and minimal
facet arthrosis without significant stenosis.

C5-6: Broad-based posterior disc osteophyte complex and asymmetric
right uncovertebral spurring result in mild spinal stenosis and
moderate right neural foraminal stenosis, similar to the prior CT.

C6-7: Broad-based posterior disc osteophyte complex without
significant stenosis.

C7-T1: Minimal facet arthrosis without stenosis. A small right
mastoid effusion is partially visualized.

CT LUMBAR MYELOGRAM FINDINGS:

The patient was initially scanned supine. However, the majority of
the contrast was layering dependently at L5 and in the upper sacral
canal. The patient was rescanned in the prone position with better
opacification of the thecal sac in the mid and upper lumbar spine,
although there is mild to moderate motion artifact on this
acquisition.

Vertebral alignment is within normal limits. Vertebral body heights
are preserved. Severe disc space narrowing is present at L5-S1.
Vacuum disc phenomenon is noted from L3-4 to L5-S1 as well as at
T12-L1. The conus medullaris terminates at L1-2. There is advanced
atherosclerotic calcification of the abdominal aorta and its major
branch vessels. Cholecystectomy clips are noted.

T12-L1:  Minimal disc bulging without stenosis.

L1-2:  No disc herniation or stenosis.

L2-3: Large left paracentral/ subarticular disc extrusion with
cephalad migration to the mid L2 vertebral body level. This results
in severe left lateral recess stenosis with left L3 nerve root
impingement. No significant neural foraminal stenosis.

L3-4: Small right subarticular disc extrusion results in mild right
lateral recess stenosis. Mild facet arthrosis. No significant neural
foraminal stenosis.

L4-5: Disc bulging and mild facet arthrosis result in minimal
bilateral neural foraminal narrowing without spinal stenosis.

L5-S1: Mild disc bulging, endplate spurring, and mild facet
arthrosis result in minimal right greater than left neural foraminal
narrowing without spinal stenosis.
IMPRESSION: 1. Multilevel cervical disc degeneration, worst at C5-6 where there
is moderate right neural foraminal stenosis with potential C6 nerve
impingement and mild spinal stenosis.
2. No stenosis elsewhere in the cervical spine.
3. Large disc extrusion at L2-3 resulting in severe left lateral
recess stenosis and left L3 nerve root impingement.
4. Mild right lateral recess stenosis at L3-4 due to a small disc
extrusion.

## 2017-09-07 DIAGNOSIS — C9200 Acute myeloblastic leukemia, not having achieved remission: Secondary | ICD-10-CM | POA: Diagnosis not present

## 2017-09-07 DIAGNOSIS — D61818 Other pancytopenia: Secondary | ICD-10-CM | POA: Diagnosis not present

## 2017-09-07 DIAGNOSIS — D709 Neutropenia, unspecified: Secondary | ICD-10-CM | POA: Diagnosis not present

## 2017-09-07 DIAGNOSIS — Z79899 Other long term (current) drug therapy: Secondary | ICD-10-CM | POA: Diagnosis not present

## 2017-09-14 DIAGNOSIS — R0600 Dyspnea, unspecified: Secondary | ICD-10-CM | POA: Diagnosis present

## 2017-09-14 DIAGNOSIS — Z955 Presence of coronary angioplasty implant and graft: Secondary | ICD-10-CM | POA: Diagnosis not present

## 2017-09-14 DIAGNOSIS — I442 Atrioventricular block, complete: Secondary | ICD-10-CM | POA: Diagnosis not present

## 2017-09-14 DIAGNOSIS — I10 Essential (primary) hypertension: Secondary | ICD-10-CM | POA: Diagnosis not present

## 2017-09-14 DIAGNOSIS — Z8349 Family history of other endocrine, nutritional and metabolic diseases: Secondary | ICD-10-CM | POA: Diagnosis not present

## 2017-09-14 DIAGNOSIS — I5022 Chronic systolic (congestive) heart failure: Secondary | ICD-10-CM | POA: Diagnosis present

## 2017-09-14 DIAGNOSIS — M316 Other giant cell arteritis: Secondary | ICD-10-CM | POA: Diagnosis not present

## 2017-09-14 DIAGNOSIS — Z950 Presence of cardiac pacemaker: Secondary | ICD-10-CM | POA: Diagnosis not present

## 2017-09-14 DIAGNOSIS — I951 Orthostatic hypotension: Secondary | ICD-10-CM | POA: Diagnosis present

## 2017-09-14 DIAGNOSIS — Z823 Family history of stroke: Secondary | ICD-10-CM | POA: Diagnosis not present

## 2017-09-14 DIAGNOSIS — R002 Palpitations: Secondary | ICD-10-CM | POA: Diagnosis not present

## 2017-09-14 DIAGNOSIS — R55 Syncope and collapse: Secondary | ICD-10-CM | POA: Diagnosis not present

## 2017-09-14 DIAGNOSIS — C9200 Acute myeloblastic leukemia, not having achieved remission: Secondary | ICD-10-CM | POA: Diagnosis present

## 2017-09-14 DIAGNOSIS — Z83511 Family history of glaucoma: Secondary | ICD-10-CM | POA: Diagnosis not present

## 2017-09-14 DIAGNOSIS — I5020 Unspecified systolic (congestive) heart failure: Secondary | ICD-10-CM | POA: Diagnosis not present

## 2017-09-14 DIAGNOSIS — Z9861 Coronary angioplasty status: Secondary | ICD-10-CM | POA: Diagnosis not present

## 2017-09-14 DIAGNOSIS — I2510 Atherosclerotic heart disease of native coronary artery without angina pectoris: Secondary | ICD-10-CM | POA: Diagnosis present

## 2017-09-14 DIAGNOSIS — D709 Neutropenia, unspecified: Secondary | ICD-10-CM | POA: Diagnosis present

## 2017-09-14 DIAGNOSIS — D61818 Other pancytopenia: Secondary | ICD-10-CM | POA: Diagnosis present

## 2017-09-14 DIAGNOSIS — R42 Dizziness and giddiness: Secondary | ICD-10-CM | POA: Diagnosis not present

## 2017-09-14 DIAGNOSIS — C92A0 Acute myeloid leukemia with multilineage dysplasia, not having achieved remission: Secondary | ICD-10-CM | POA: Diagnosis not present

## 2017-09-14 DIAGNOSIS — R0602 Shortness of breath: Secondary | ICD-10-CM | POA: Diagnosis not present

## 2017-09-14 DIAGNOSIS — R079 Chest pain, unspecified: Secondary | ICD-10-CM | POA: Diagnosis not present

## 2017-09-14 DIAGNOSIS — Z888 Allergy status to other drugs, medicaments and biological substances status: Secondary | ICD-10-CM | POA: Diagnosis not present

## 2017-09-14 DIAGNOSIS — T451X5A Adverse effect of antineoplastic and immunosuppressive drugs, initial encounter: Secondary | ICD-10-CM | POA: Diagnosis not present

## 2017-09-14 DIAGNOSIS — I110 Hypertensive heart disease with heart failure: Secondary | ICD-10-CM | POA: Diagnosis present

## 2017-09-14 DIAGNOSIS — Z809 Family history of malignant neoplasm, unspecified: Secondary | ICD-10-CM | POA: Diagnosis not present

## 2017-09-20 ENCOUNTER — Telehealth: Payer: Self-pay | Admitting: Cardiology

## 2017-09-20 ENCOUNTER — Ambulatory Visit (INDEPENDENT_AMBULATORY_CARE_PROVIDER_SITE_OTHER): Payer: Medicare Other | Admitting: *Deleted

## 2017-09-20 DIAGNOSIS — I442 Atrioventricular block, complete: Secondary | ICD-10-CM

## 2017-09-20 NOTE — Telephone Encounter (Signed)
Confirmed remote transmission w/ pt husband.   

## 2017-09-21 ENCOUNTER — Encounter: Payer: Self-pay | Admitting: Cardiology

## 2017-09-21 DIAGNOSIS — Z950 Presence of cardiac pacemaker: Secondary | ICD-10-CM | POA: Diagnosis not present

## 2017-09-21 DIAGNOSIS — I951 Orthostatic hypotension: Secondary | ICD-10-CM | POA: Diagnosis not present

## 2017-09-21 DIAGNOSIS — Z95818 Presence of other cardiac implants and grafts: Secondary | ICD-10-CM | POA: Diagnosis not present

## 2017-09-21 DIAGNOSIS — C9200 Acute myeloblastic leukemia, not having achieved remission: Secondary | ICD-10-CM | POA: Diagnosis not present

## 2017-09-21 DIAGNOSIS — K219 Gastro-esophageal reflux disease without esophagitis: Secondary | ICD-10-CM | POA: Diagnosis not present

## 2017-09-21 DIAGNOSIS — I2510 Atherosclerotic heart disease of native coronary artery without angina pectoris: Secondary | ICD-10-CM | POA: Diagnosis not present

## 2017-09-21 DIAGNOSIS — D709 Neutropenia, unspecified: Secondary | ICD-10-CM | POA: Diagnosis not present

## 2017-09-21 DIAGNOSIS — I442 Atrioventricular block, complete: Secondary | ICD-10-CM | POA: Diagnosis not present

## 2017-09-21 DIAGNOSIS — I110 Hypertensive heart disease with heart failure: Secondary | ICD-10-CM | POA: Diagnosis not present

## 2017-09-21 DIAGNOSIS — R0609 Other forms of dyspnea: Secondary | ICD-10-CM | POA: Diagnosis not present

## 2017-09-21 DIAGNOSIS — R42 Dizziness and giddiness: Secondary | ICD-10-CM | POA: Diagnosis not present

## 2017-09-21 DIAGNOSIS — I5189 Other ill-defined heart diseases: Secondary | ICD-10-CM | POA: Diagnosis not present

## 2017-09-21 NOTE — Progress Notes (Signed)
Remote pacemaker transmission.   

## 2017-09-25 DIAGNOSIS — C9200 Acute myeloblastic leukemia, not having achieved remission: Secondary | ICD-10-CM | POA: Diagnosis not present

## 2017-09-28 DIAGNOSIS — C9200 Acute myeloblastic leukemia, not having achieved remission: Secondary | ICD-10-CM | POA: Diagnosis not present

## 2017-10-02 DIAGNOSIS — Z79899 Other long term (current) drug therapy: Secondary | ICD-10-CM | POA: Diagnosis not present

## 2017-10-02 DIAGNOSIS — C9200 Acute myeloblastic leukemia, not having achieved remission: Secondary | ICD-10-CM | POA: Diagnosis not present

## 2017-10-02 DIAGNOSIS — Z5181 Encounter for therapeutic drug level monitoring: Secondary | ICD-10-CM | POA: Diagnosis not present

## 2017-10-06 DIAGNOSIS — C9200 Acute myeloblastic leukemia, not having achieved remission: Secondary | ICD-10-CM | POA: Diagnosis not present

## 2017-10-09 DIAGNOSIS — C9200 Acute myeloblastic leukemia, not having achieved remission: Secondary | ICD-10-CM | POA: Diagnosis not present

## 2017-10-10 DIAGNOSIS — C9200 Acute myeloblastic leukemia, not having achieved remission: Secondary | ICD-10-CM | POA: Diagnosis not present

## 2017-10-10 DIAGNOSIS — Z5111 Encounter for antineoplastic chemotherapy: Secondary | ICD-10-CM | POA: Diagnosis not present

## 2017-10-10 LAB — CUP PACEART REMOTE DEVICE CHECK
Battery Remaining Longevity: 101 mo
Battery Remaining Percentage: 95.5 %
Brady Statistic AP VP Percent: 16 %
Brady Statistic AP VS Percent: 1 %
Brady Statistic RV Percent Paced: 99 %
Date Time Interrogation Session: 20190620150248
Lead Channel Impedance Value: 540 Ohm
Lead Channel Pacing Threshold Amplitude: 0.75 V
Lead Channel Sensing Intrinsic Amplitude: 12 mV
Lead Channel Sensing Intrinsic Amplitude: 3.1 mV
Lead Channel Setting Pacing Amplitude: 2 V
Lead Channel Setting Pacing Amplitude: 2.5 V
Lead Channel Setting Pacing Pulse Width: 0.5 ms
Lead Channel Setting Sensing Sensitivity: 4 mV
MDC IDC LEAD IMPLANT DT: 20090813
MDC IDC LEAD IMPLANT DT: 20090813
MDC IDC LEAD LOCATION: 753859
MDC IDC LEAD LOCATION: 753860
MDC IDC MSMT BATTERY VOLTAGE: 3.02 V
MDC IDC MSMT LEADCHNL RA IMPEDANCE VALUE: 410 Ohm
MDC IDC MSMT LEADCHNL RA PACING THRESHOLD AMPLITUDE: 0.5 V
MDC IDC MSMT LEADCHNL RA PACING THRESHOLD PULSEWIDTH: 0.5 ms
MDC IDC MSMT LEADCHNL RV PACING THRESHOLD PULSEWIDTH: 0.5 ms
MDC IDC PG IMPLANT DT: 20180925
MDC IDC STAT BRADY AS VP PERCENT: 84 %
MDC IDC STAT BRADY AS VS PERCENT: 1 %
MDC IDC STAT BRADY RA PERCENT PACED: 16 %
Pulse Gen Model: 2272
Pulse Gen Serial Number: 8943792

## 2017-10-11 DIAGNOSIS — Z5111 Encounter for antineoplastic chemotherapy: Secondary | ICD-10-CM | POA: Diagnosis not present

## 2017-10-11 DIAGNOSIS — C9200 Acute myeloblastic leukemia, not having achieved remission: Secondary | ICD-10-CM | POA: Diagnosis not present

## 2017-10-12 DIAGNOSIS — C9200 Acute myeloblastic leukemia, not having achieved remission: Secondary | ICD-10-CM | POA: Diagnosis not present

## 2017-10-12 DIAGNOSIS — Z5111 Encounter for antineoplastic chemotherapy: Secondary | ICD-10-CM | POA: Diagnosis not present

## 2017-10-13 DIAGNOSIS — Z79899 Other long term (current) drug therapy: Secondary | ICD-10-CM | POA: Diagnosis not present

## 2017-10-13 DIAGNOSIS — C9200 Acute myeloblastic leukemia, not having achieved remission: Secondary | ICD-10-CM | POA: Diagnosis not present

## 2017-10-13 DIAGNOSIS — Z5111 Encounter for antineoplastic chemotherapy: Secondary | ICD-10-CM | POA: Diagnosis not present

## 2017-10-16 DIAGNOSIS — C9200 Acute myeloblastic leukemia, not having achieved remission: Secondary | ICD-10-CM | POA: Diagnosis not present

## 2017-10-19 DIAGNOSIS — C9200 Acute myeloblastic leukemia, not having achieved remission: Secondary | ICD-10-CM | POA: Diagnosis not present

## 2017-10-23 DIAGNOSIS — T148XXA Other injury of unspecified body region, initial encounter: Secondary | ICD-10-CM | POA: Diagnosis not present

## 2017-10-23 DIAGNOSIS — C9200 Acute myeloblastic leukemia, not having achieved remission: Secondary | ICD-10-CM | POA: Diagnosis not present

## 2017-10-26 DIAGNOSIS — C9200 Acute myeloblastic leukemia, not having achieved remission: Secondary | ICD-10-CM | POA: Diagnosis not present

## 2017-10-30 DIAGNOSIS — C9200 Acute myeloblastic leukemia, not having achieved remission: Secondary | ICD-10-CM | POA: Diagnosis not present

## 2017-11-02 DIAGNOSIS — D61818 Other pancytopenia: Secondary | ICD-10-CM | POA: Diagnosis not present

## 2017-11-02 DIAGNOSIS — C9200 Acute myeloblastic leukemia, not having achieved remission: Secondary | ICD-10-CM | POA: Diagnosis not present

## 2017-11-02 DIAGNOSIS — Z9221 Personal history of antineoplastic chemotherapy: Secondary | ICD-10-CM | POA: Diagnosis not present

## 2017-11-09 DIAGNOSIS — C9200 Acute myeloblastic leukemia, not having achieved remission: Secondary | ICD-10-CM | POA: Diagnosis not present

## 2017-11-16 DIAGNOSIS — C9200 Acute myeloblastic leukemia, not having achieved remission: Secondary | ICD-10-CM | POA: Diagnosis not present

## 2017-11-20 DIAGNOSIS — Z79899 Other long term (current) drug therapy: Secondary | ICD-10-CM | POA: Diagnosis not present

## 2017-11-20 DIAGNOSIS — L237 Allergic contact dermatitis due to plants, except food: Secondary | ICD-10-CM | POA: Diagnosis not present

## 2017-11-20 DIAGNOSIS — Z5181 Encounter for therapeutic drug level monitoring: Secondary | ICD-10-CM | POA: Diagnosis not present

## 2017-11-20 DIAGNOSIS — C9200 Acute myeloblastic leukemia, not having achieved remission: Secondary | ICD-10-CM | POA: Diagnosis not present

## 2017-11-20 DIAGNOSIS — Z5111 Encounter for antineoplastic chemotherapy: Secondary | ICD-10-CM | POA: Diagnosis not present

## 2017-11-20 DIAGNOSIS — Z01812 Encounter for preprocedural laboratory examination: Secondary | ICD-10-CM | POA: Diagnosis not present

## 2017-11-21 DIAGNOSIS — Z79899 Other long term (current) drug therapy: Secondary | ICD-10-CM | POA: Diagnosis not present

## 2017-11-21 DIAGNOSIS — Z5111 Encounter for antineoplastic chemotherapy: Secondary | ICD-10-CM | POA: Diagnosis not present

## 2017-11-21 DIAGNOSIS — C9200 Acute myeloblastic leukemia, not having achieved remission: Secondary | ICD-10-CM | POA: Diagnosis not present

## 2017-11-22 DIAGNOSIS — Z5111 Encounter for antineoplastic chemotherapy: Secondary | ICD-10-CM | POA: Diagnosis not present

## 2017-11-22 DIAGNOSIS — C9200 Acute myeloblastic leukemia, not having achieved remission: Secondary | ICD-10-CM | POA: Diagnosis not present

## 2017-11-22 NOTE — Progress Notes (Signed)
HPI  Patient presents for followup of coronary disease. Since I last saw her she has been diagnosed with acute myelogenous leukemia.  She is being managed at [facility].  She did have some hypotension and was briefly treated with Florinef.  However, this was a diastolic hypotension and her systolic blood pressures run high.  She is having no real presyncope or syncope.  She feels lightheaded if she is on her chemotherapy.  She did have an echocardiogram which demonstrated a mildly reduced ejection fraction of 45 to 50% with no significant valvular abnormalities.  I was able to review this result in Care Everywhere..  She is had no new shortness of breath, PND or orthopnea.  She said no new palpitations, presyncope or syncope.  She denies any chest pressure, neck or arm discomfort.  Of note she has not been drinking or eating well at the time her blood pressures were low and she is doing a little bit better with this.  Allergies  Allergen Reactions  . Hydrocodone Shortness Of Breath and Other (See Comments)    SOB, passed out feeling, nausea   . Codeine Nausea And Vomiting and Other (See Comments)    Also passed out  . Cymbalta [Duloxetine Hcl] Nausea And Vomiting and Other (See Comments)    Also passed out  . Lyrica [Pregabalin] Nausea And Vomiting    Current Outpatient Medications  Medication Sig Dispense Refill  . atorvastatin (LIPITOR) 20 MG tablet Take 1 tablet (20 mg total) by mouth daily at 6 PM.    . B Complex-C (B-COMPLEX WITH VITAMIN C) tablet Take 1 tablet by mouth daily.    . ondansetron (ZOFRAN) 8 MG tablet Take by mouth.    . pantoprazole (PROTONIX) 40 MG tablet Take by mouth.    . prochlorperazine (COMPAZINE) 5 MG tablet TK 1 T PO Q 6 H PRN FOR NAUSEA  0  . traMADol (ULTRAM) 50 MG tablet tramadol 50 mg tablet  TK 1 T PO TID PRN    . VENCLEXTA 100 MG TABS   11   No current facility-administered medications for this visit.     Past Medical History:  Diagnosis Date    . Arthritis    R shoulder, both knees & low back- degenerative   . Asthma    as a child  . Complete heart block ([place])    s/p PPM  . Complication of anesthesia 2011   told that it as hard to pass tube when they intubated for pacemaker insertion   . Coronary artery disease    s/p PCI  . Family history of adverse reaction to anesthesia    father hard to put to sleep but he also used a lot alcohol    . Giant cell arteritis (HCC)    polymyalgia rheumatica  . H/O: hysterectomy   . History of hiatal hernia   . Hyperlipidemia   . Hypertension    not treated with med.   . Irritable bowel syndrome   . Polymyalgia rheumatica ([place])   . Presence of permanent cardiac pacemaker   . S/P breast biopsy    secondary to nipple discharge    Past Surgical History:  Procedure Laterality Date  . ABDOMINAL HYSTERECTOMY    . APPENDECTOMY    . BREAST BIOPSY     secondary to nipple discharge  . CARDIAC CATHETERIZATION  2001   x1stent - Alfred Little   . CESAREAN SECTION    . CHOLECYSTECTOMY    .  EYE SURGERY     cataracts removed & IOL- both eyes   . INSERT / REPLACE / REMOVE PACEMAKER    . KNEE ARTHROSCOPY Left   . LUMBAR LAMINECTOMY/DECOMPRESSION MICRODISCECTOMY Left 06/08/2015   Procedure: Left Lumbar Two-Three Microdiskectomy;  Surgeon: Kary Kos, MD;  Location: [facility];  Service: Neurosurgery;  Laterality: Left;  Left L2-3 Microdiskectomy  . PACEMAKER INSERTION  11/15/2007   St. Jude Pacemaker Zephyr XL implanted in [place]  . pilondial cyst resection    . PPM GENERATOR CHANGEOUT N/A 12/27/2016   SJM Assurity MRI generator change by Dr Rayann Heman  . TEMPORAL ARTERY BIOPSY / LIGATION    . TONSILLECTOMY      ROS:   As stated in the HPI and negative for all other systems.   PHYSICAL EXAM BP (!) 134/52 (BP Location: Left Arm, Patient Position: Sitting)   Pulse 74   Ht 5' (1.524 m)   Wt 125 lb (56.7 kg)   SpO2 98%   BMI 24.41 kg/m   GENERAL:  Well appearing NECK:  No jugular venous  distention, waveform within normal limits, carotid upstroke brisk and symmetric, no bruits, no thyromegaly LUNGS:  Clear to auscultation bilaterally CHEST:  Unremarkable HEART:  PMI not displaced or sustained,S1 and S2 within normal limits, no S3, no S4, no clicks, no rubs, no murmurs ABD:  Flat, positive bowel sounds normal in frequency in pitch, no bruits, no rebound, no guarding, no midline pulsatile mass, no hepatomegaly, no splenomegaly EXT:  2 plus pulses throughout, no edema, no cyanosis no clubbing    EKG:  NA   ASSESSMENT AND PLAN    CAD -  She had a low risk stress perfusion study in Dec 2012.  At this point in time no further cardiovascular testing is suggested.  She is not having any anginal symptoms.  HYPERTENSION -  The blood pressure is actually running low.  I suggested that she try to avoid the Florinef.  She will increase her fluid intake to stay properly hydrated may have a little more salt.  I cautioned her about getting short of breath with her mildly reduced ejection fraction.  She will let me know if that happens.  No further testing is indicated.   PACEMAKER, PERMANENT -  She is up to date with follow-up.  PALPIT ATIONS - This is currently not a problem.  No change in therapy.

## 2017-11-23 ENCOUNTER — Ambulatory Visit (INDEPENDENT_AMBULATORY_CARE_PROVIDER_SITE_OTHER): Payer: Medicare Other | Admitting: Cardiology

## 2017-11-23 ENCOUNTER — Encounter: Payer: Self-pay | Admitting: Cardiology

## 2017-11-23 VITALS — BP 134/52 | HR 74 | Ht 60.0 in | Wt 125.0 lb

## 2017-11-23 DIAGNOSIS — I959 Hypotension, unspecified: Secondary | ICD-10-CM | POA: Diagnosis not present

## 2017-11-23 DIAGNOSIS — Z5111 Encounter for antineoplastic chemotherapy: Secondary | ICD-10-CM | POA: Diagnosis not present

## 2017-11-23 DIAGNOSIS — C9200 Acute myeloblastic leukemia, not having achieved remission: Secondary | ICD-10-CM | POA: Diagnosis not present

## 2017-11-23 DIAGNOSIS — I2510 Atherosclerotic heart disease of native coronary artery without angina pectoris: Secondary | ICD-10-CM | POA: Diagnosis not present

## 2017-11-23 DIAGNOSIS — Z79899 Other long term (current) drug therapy: Secondary | ICD-10-CM | POA: Diagnosis not present

## 2017-11-23 NOTE — Patient Instructions (Signed)
Medication Instructions:  Continue current medications  If you need a refill on your cardiac medications before your next appointment, please call your pharmacy.  Labwork: None Ordered   Testing/Procedures: None Ordered   Follow-Up: Your physician wants you to follow-up in: 12 Months. You should receive a reminder letter in the mail two months in advance. If you do not receive a letter, please call our office in [phone] to schedule your follow-up appointment.      Thank you for choosing [facility] at [facility]!!

## 2017-11-24 DIAGNOSIS — Z5111 Encounter for antineoplastic chemotherapy: Secondary | ICD-10-CM | POA: Diagnosis not present

## 2017-11-24 DIAGNOSIS — Z79899 Other long term (current) drug therapy: Secondary | ICD-10-CM | POA: Diagnosis not present

## 2017-11-24 DIAGNOSIS — C9200 Acute myeloblastic leukemia, not having achieved remission: Secondary | ICD-10-CM | POA: Diagnosis not present

## 2017-11-30 DIAGNOSIS — C9200 Acute myeloblastic leukemia, not having achieved remission: Secondary | ICD-10-CM | POA: Diagnosis not present

## 2017-12-05 DIAGNOSIS — C9200 Acute myeloblastic leukemia, not having achieved remission: Secondary | ICD-10-CM | POA: Diagnosis not present

## 2017-12-07 DIAGNOSIS — C9200 Acute myeloblastic leukemia, not having achieved remission: Secondary | ICD-10-CM | POA: Diagnosis not present

## 2017-12-11 DIAGNOSIS — C9200 Acute myeloblastic leukemia, not having achieved remission: Secondary | ICD-10-CM | POA: Diagnosis not present

## 2017-12-14 DIAGNOSIS — C9200 Acute myeloblastic leukemia, not having achieved remission: Secondary | ICD-10-CM | POA: Diagnosis not present

## 2017-12-20 ENCOUNTER — Ambulatory Visit (INDEPENDENT_AMBULATORY_CARE_PROVIDER_SITE_OTHER): Payer: Medicare Other | Admitting: *Deleted

## 2017-12-20 DIAGNOSIS — I442 Atrioventricular block, complete: Secondary | ICD-10-CM

## 2017-12-20 NOTE — Progress Notes (Signed)
Remote pacemaker transmission.   

## 2017-12-21 DIAGNOSIS — C9200 Acute myeloblastic leukemia, not having achieved remission: Secondary | ICD-10-CM | POA: Diagnosis not present

## 2017-12-22 ENCOUNTER — Encounter (HOSPITAL_COMMUNITY): Payer: Self-pay | Admitting: Emergency Medicine

## 2017-12-22 ENCOUNTER — Other Ambulatory Visit: Payer: Self-pay

## 2017-12-22 ENCOUNTER — Emergency Department (HOSPITAL_COMMUNITY)
Admission: EM | Admit: 2017-12-22 | Discharge: 2017-12-22 | Disposition: A | Discharge status: Home / Self Care | Payer: Medicare Other | Attending: Emergency Medicine | Admitting: Emergency Medicine

## 2017-12-22 DIAGNOSIS — I2510 Atherosclerotic heart disease of native coronary artery without angina pectoris: Secondary | ICD-10-CM | POA: Insufficient documentation

## 2017-12-22 DIAGNOSIS — I10 Essential (primary) hypertension: Secondary | ICD-10-CM | POA: Insufficient documentation

## 2017-12-22 DIAGNOSIS — J45909 Unspecified asthma, uncomplicated: Secondary | ICD-10-CM | POA: Insufficient documentation

## 2017-12-22 DIAGNOSIS — I959 Hypotension, unspecified: Secondary | ICD-10-CM | POA: Diagnosis not present

## 2017-12-22 DIAGNOSIS — Z950 Presence of cardiac pacemaker: Secondary | ICD-10-CM | POA: Insufficient documentation

## 2017-12-22 DIAGNOSIS — Z79899 Other long term (current) drug therapy: Secondary | ICD-10-CM | POA: Insufficient documentation

## 2017-12-22 DIAGNOSIS — K5641 Fecal impaction: Secondary | ICD-10-CM | POA: Insufficient documentation

## 2017-12-22 DIAGNOSIS — K5900 Constipation, unspecified: Secondary | ICD-10-CM | POA: Diagnosis not present

## 2017-12-22 DIAGNOSIS — R52 Pain, unspecified: Secondary | ICD-10-CM | POA: Diagnosis not present

## 2017-12-22 MED ORDER — POLYETHYLENE GLYCOL 3350 17 G PO PACK: 17.0000 g | PACK | Freq: Every day | ORAL | 0 refills | Status: AC | PRN

## 2017-12-22 MED ORDER — LORAZEPAM 1 MG PO TABS
1.0000 mg | ORAL_TABLET | Freq: Once | ORAL | Status: AC
  Administered 2017-12-22: 1 mg via ORAL
  Filled 2017-12-22: qty 1

## 2017-12-22 NOTE — ED Provider Notes (Signed)
[place] DEPT Provider Note   CSN: [id number] Arrival date & time: 12/22/17  1629     History   Chief Complaint Chief Complaint  Patient presents with  . Constipation    HPI Jamie Reese is a 84 y.o. female.  HPI Patient has been unable to pass stool for the past 2 days.  States she is having rectal spasms.  She is attempted to use stool softeners, suppositories and enemas without success.  Noted to have hard stool in the rectal vault.  Patient denies any abdominal pain.  No nausea or vomiting.  No rectal bleeding.  No fever or chills. Past Medical History:  Diagnosis Date  . Arthritis    R shoulder, both knees & low back- degenerative   . Asthma    as a child  . Complete heart block ([place])    s/p PPM  . Complication of anesthesia 2011   told that it as hard to pass tube when they intubated for pacemaker insertion   . Coronary artery disease    s/p PCI  . Family history of adverse reaction to anesthesia    father hard to put to sleep but he also used a lot alcohol    . Giant cell arteritis (HCC)    polymyalgia rheumatica  . H/O: hysterectomy   . History of hiatal hernia   . Hyperlipidemia   . Hypertension    not treated with med.   . Irritable bowel syndrome   . Polymyalgia rheumatica ([place])   . Presence of permanent cardiac pacemaker   . S/P breast biopsy    secondary to nipple discharge    Patient Active Problem List   Diagnosis Date Noted  . Hypotension 11/23/2017  . Coronary artery disease involving native coronary artery of native heart without angina pectoris 11/23/2017  . Acute myeloid leukemia not having achieved remission ([place])   . Antineoplastic chemotherapy induced pancytopenia ([place])   . Counseling regarding advance care planning and goals of care   . Neutropenia ([place])   . Macrocytic anemia 07/10/2017  . Thrombocytopenia ([place]) 07/10/2017  . HNP (herniated nucleus pulposus), lumbar 06/08/2015  . Complete heart  block ([place]) 04/24/2013  . Corneal edema, secondary 07/11/2011  . H/O cataract extraction 07/11/2011  . PACEMAKER, PERMANENT 12/30/2008  . HYPERLIPIDEMIA 12/29/2008  . Essential hypertension 12/29/2008  . Coronary atherosclerosis 12/29/2008  . SYNCOPE 12/29/2008    Past Surgical History:  Procedure Laterality Date  . ABDOMINAL HYSTERECTOMY    . APPENDECTOMY    . BREAST BIOPSY     secondary to nipple discharge  . CARDIAC CATHETERIZATION  2001   x1stent - Alfred Little   . CESAREAN SECTION    . CHOLECYSTECTOMY    . EYE SURGERY     cataracts removed & IOL- both eyes   . INSERT / REPLACE / REMOVE PACEMAKER    . KNEE ARTHROSCOPY Left   . LUMBAR LAMINECTOMY/DECOMPRESSION MICRODISCECTOMY Left 06/08/2015   Procedure: Left Lumbar Two-Three Microdiskectomy;  Surgeon: Kary Kos, MD;  Location: [place] [facility];  Service: Neurosurgery;  Laterality: Left;  Left L2-3 Microdiskectomy  . PACEMAKER INSERTION  11/15/2007   St. Jude Pacemaker Zephyr XL implanted in [place]  . pilondial cyst resection    . PPM GENERATOR CHANGEOUT N/A 12/27/2016   SJM Assurity MRI generator change by Dr Rayann Heman  . TEMPORAL ARTERY BIOPSY / LIGATION    . TONSILLECTOMY       OB History   None  Home Medications    Prior to Admission medications   Medication Sig Start Date End Date Taking? Authorizing Provider  atorvastatin (LIPITOR) 20 MG tablet Take 1 tablet (20 mg total) by mouth daily at 6 PM. 07/13/17  Yes Velvet Bathe, MD  levofloxacin (LEVAQUIN) 500 MG tablet Take 500 mg by mouth daily.  12/21/17  Yes [provider]  B Complex-C (B-COMPLEX WITH VITAMIN C) tablet Take 1 tablet by mouth daily. Patient not taking: Reported on 12/22/2017 07/14/17   Velvet Bathe, MD  polyethylene glycol [facility] / Floria Raveling) packet Take 17 g by mouth daily as needed for moderate constipation or severe constipation. 12/22/17   Julianne Rice, MD  Vitamin D, Ergocalciferol, (DRISDOL) 50000 units CAPS capsule Take 50,000 Units by  mouth every 7 (seven) days.  12/09/17   [provider]    Family History Family History  Problem Relation Age of Onset  . Heart attack Father 20  . Heart Problems Mother        PPM  . Cancer Unknown     Social History Social History   Tobacco Use  . Smoking status: Never Smoker  . Smokeless tobacco: Never Used  Substance Use Topics  . Alcohol use: No  . Drug use: No     Allergies   Hydrocodone; Codeine; Cymbalta [duloxetine hcl]; and Lyrica [pregabalin]   Review of Systems Review of Systems  Constitutional: Negative for chills and fever.  Gastrointestinal: Positive for constipation and rectal pain. Negative for abdominal pain, blood in stool, diarrhea, nausea and vomiting.  Genitourinary: Negative for dysuria and flank pain.  Musculoskeletal: Negative for back pain, myalgias, neck pain and neck stiffness.  Skin: Negative for rash and wound.  Neurological: Negative for dizziness, weakness, light-headedness, numbness and headaches.  All other systems reviewed and are negative.    Physical Exam Updated Vital Signs BP (!) 101/46   Pulse (!) 107   Temp 98.5 F (36.9 C) (Oral)   Resp (!) 21   Ht 5' (1.524 m)   Wt 54.4 kg   SpO2 98%   BMI 23.44 kg/m   Physical Exam  Constitutional: She is oriented to person, place, and time. She appears well-developed and well-nourished.  HENT:  Head: Normocephalic and atraumatic.  Mouth/Throat: Oropharynx is clear and moist.  Eyes: Pupils are equal, round, and reactive to light. EOM are normal.  Neck: Normal range of motion. Neck supple.  Cardiovascular: Normal rate and regular rhythm.  Pulmonary/Chest: Effort normal and breath sounds normal.  Abdominal: Soft. Bowel sounds are normal. She exhibits no distension. There is no tenderness. There is no rebound and no guarding.  Rectal vault impacted with hard stool.  No obvious soft tissue masses, fissures or gross blood  Musculoskeletal: Normal range of motion. She  exhibits no edema or tenderness.  Neurological: She is alert and oriented to person, place, and time.  Skin: Skin is warm and dry. No rash noted. She is not diaphoretic. No erythema.  Psychiatric: She has a normal mood and affect. Her behavior is normal.  Nursing note and vitals reviewed.    ED Treatments / Results  Labs (all labs ordered are listed, but only abnormal results are displayed) Labs Reviewed - No data to display  EKG None  Radiology No results found.  Procedures Procedures (including critical care time)  Medications Ordered in ED Medications  LORazepam (ATIVAN) tablet 1 mg (1 mg Oral Given 12/22/17 1718)     Initial Impression / Assessment and Plan / ED Course  I have reviewed the triage vital signs and the nursing notes.  Pertinent labs & imaging results that were available during my care of the patient were reviewed by me and considered in my medical decision making (see chart for details).     Manually disimpacted with removal of multiple hard clumps of stool.  Patient was able to have a small bowel movement on her own.  States she is feeling much better.  Abdominal exam remains benign.  Will start on MiraLAX and have follow-up with primary physician as needed.  Return precautions given.  Final Clinical Impressions(s) / ED Diagnoses   Final diagnoses:  Impacted stool in rectum [facility])    ED Discharge Orders         Ordered    polyethylene glycol (MIRALAX / GLYCOLAX) packet  Daily PRN     12/22/17 2010           Julianne Rice, MD 12/22/17 2011

## 2017-12-22 NOTE — ED Notes (Signed)
Bed: OM85 Expected date:  Expected time:  Means of arrival:  Comments: EMS-constipation

## 2017-12-22 NOTE — ED Notes (Signed)
This Probation officer and RN Ngozi called to patient bedside by [employer], related to patient weakness and decrease in blood pressure. Patient was pale and awake but lethargic when we got into the room. Patient laid flat and blood pressure retaken. Patient blood pressure came back up and patient regained color. Patient AxOx4 shortly thereafter. MD Lita Mains made aware. Patient family at bedside.

## 2017-12-22 NOTE — ED Triage Notes (Signed)
Patient arrived by EMS from home. Patient c/o of constipation and increased rectal pain. No nausea, vomiting, or abd.. pain per EMS. Active bowel sounds per EMS. Pt used over the counter suppository and enema with no relief. Hx of leukemia which is being treated at another institution. Per EMS. CBG 125. BP 118/86, HR 60, Spo2 97%. Diastolic pressure normally runs in the 50's per EMS.

## 2017-12-28 DIAGNOSIS — C9200 Acute myeloblastic leukemia, not having achieved remission: Secondary | ICD-10-CM | POA: Diagnosis not present

## 2017-12-28 DIAGNOSIS — K5900 Constipation, unspecified: Secondary | ICD-10-CM | POA: Diagnosis not present

## 2017-12-28 DIAGNOSIS — Z9221 Personal history of antineoplastic chemotherapy: Secondary | ICD-10-CM | POA: Diagnosis not present

## 2018-01-01 DIAGNOSIS — Z79899 Other long term (current) drug therapy: Secondary | ICD-10-CM | POA: Diagnosis not present

## 2018-01-01 DIAGNOSIS — C9200 Acute myeloblastic leukemia, not having achieved remission: Secondary | ICD-10-CM | POA: Diagnosis not present

## 2018-01-01 DIAGNOSIS — Z5111 Encounter for antineoplastic chemotherapy: Secondary | ICD-10-CM | POA: Diagnosis not present

## 2018-01-02 DIAGNOSIS — C9200 Acute myeloblastic leukemia, not having achieved remission: Secondary | ICD-10-CM | POA: Diagnosis not present

## 2018-01-02 DIAGNOSIS — Z79899 Other long term (current) drug therapy: Secondary | ICD-10-CM | POA: Diagnosis not present

## 2018-01-02 DIAGNOSIS — Z5111 Encounter for antineoplastic chemotherapy: Secondary | ICD-10-CM | POA: Diagnosis not present

## 2018-01-03 DIAGNOSIS — Z5111 Encounter for antineoplastic chemotherapy: Secondary | ICD-10-CM | POA: Diagnosis not present

## 2018-01-03 DIAGNOSIS — C9200 Acute myeloblastic leukemia, not having achieved remission: Secondary | ICD-10-CM | POA: Diagnosis not present

## 2018-01-03 DIAGNOSIS — Z79899 Other long term (current) drug therapy: Secondary | ICD-10-CM | POA: Diagnosis not present

## 2018-01-04 DIAGNOSIS — Z79899 Other long term (current) drug therapy: Secondary | ICD-10-CM | POA: Diagnosis not present

## 2018-01-04 DIAGNOSIS — Z5111 Encounter for antineoplastic chemotherapy: Secondary | ICD-10-CM | POA: Diagnosis not present

## 2018-01-04 DIAGNOSIS — C9200 Acute myeloblastic leukemia, not having achieved remission: Secondary | ICD-10-CM | POA: Diagnosis not present

## 2018-01-05 DIAGNOSIS — Z5111 Encounter for antineoplastic chemotherapy: Secondary | ICD-10-CM | POA: Diagnosis not present

## 2018-01-05 DIAGNOSIS — C9200 Acute myeloblastic leukemia, not having achieved remission: Secondary | ICD-10-CM | POA: Diagnosis not present

## 2018-01-05 DIAGNOSIS — Z79899 Other long term (current) drug therapy: Secondary | ICD-10-CM | POA: Diagnosis not present

## 2018-01-11 DIAGNOSIS — Z79899 Other long term (current) drug therapy: Secondary | ICD-10-CM | POA: Diagnosis not present

## 2018-01-15 DIAGNOSIS — C9200 Acute myeloblastic leukemia, not having achieved remission: Secondary | ICD-10-CM | POA: Diagnosis not present

## 2018-01-18 DIAGNOSIS — C9200 Acute myeloblastic leukemia, not having achieved remission: Secondary | ICD-10-CM | POA: Diagnosis not present

## 2018-01-18 DIAGNOSIS — Z79899 Other long term (current) drug therapy: Secondary | ICD-10-CM | POA: Diagnosis not present

## 2018-01-22 DIAGNOSIS — C9200 Acute myeloblastic leukemia, not having achieved remission: Secondary | ICD-10-CM | POA: Diagnosis not present

## 2018-01-22 LAB — CUP PACEART REMOTE DEVICE CHECK
Battery Remaining Longevity: 100 mo
Battery Remaining Percentage: 95.5 %
Battery Voltage: 3.01 V
Brady Statistic AP VS Percent: 1 %
Brady Statistic AS VS Percent: 1 %
Brady Statistic RA Percent Paced: 15 %
Date Time Interrogation Session: 20190918080015
Implantable Lead Implant Date: 20090813
Implantable Lead Location: 753859
Implantable Lead Location: 753860
Implantable Pulse Generator Implant Date: 20180925
Lead Channel Impedance Value: 410 Ohm
Lead Channel Pacing Threshold Amplitude: 0.5 V
Lead Channel Pacing Threshold Amplitude: 0.75 V
Lead Channel Pacing Threshold Pulse Width: 0.5 ms
Lead Channel Sensing Intrinsic Amplitude: 3.4 mV
Lead Channel Setting Pacing Pulse Width: 0.5 ms
MDC IDC LEAD IMPLANT DT: 20090813
MDC IDC MSMT LEADCHNL RV IMPEDANCE VALUE: 560 Ohm
MDC IDC MSMT LEADCHNL RV PACING THRESHOLD PULSEWIDTH: 0.5 ms
MDC IDC MSMT LEADCHNL RV SENSING INTR AMPL: 12 mV
MDC IDC SET LEADCHNL RA PACING AMPLITUDE: 2 V
MDC IDC SET LEADCHNL RV PACING AMPLITUDE: 2.5 V
MDC IDC SET LEADCHNL RV SENSING SENSITIVITY: 4 mV
MDC IDC STAT BRADY AP VP PERCENT: 15 %
MDC IDC STAT BRADY AS VP PERCENT: 85 %
MDC IDC STAT BRADY RV PERCENT PACED: 99 %
Pulse Gen Model: 2272
Pulse Gen Serial Number: 8943792

## 2018-01-25 DIAGNOSIS — C9200 Acute myeloblastic leukemia, not having achieved remission: Secondary | ICD-10-CM | POA: Diagnosis not present

## 2018-02-01 DIAGNOSIS — C9200 Acute myeloblastic leukemia, not having achieved remission: Secondary | ICD-10-CM | POA: Diagnosis not present

## 2018-02-08 DIAGNOSIS — R600 Localized edema: Secondary | ICD-10-CM | POA: Diagnosis not present

## 2018-02-08 DIAGNOSIS — Z79899 Other long term (current) drug therapy: Secondary | ICD-10-CM | POA: Diagnosis not present

## 2018-02-08 DIAGNOSIS — I10 Essential (primary) hypertension: Secondary | ICD-10-CM | POA: Diagnosis not present

## 2018-02-08 DIAGNOSIS — I2510 Atherosclerotic heart disease of native coronary artery without angina pectoris: Secondary | ICD-10-CM | POA: Diagnosis not present

## 2018-02-08 DIAGNOSIS — R0602 Shortness of breath: Secondary | ICD-10-CM | POA: Diagnosis not present

## 2018-02-08 DIAGNOSIS — C9200 Acute myeloblastic leukemia, not having achieved remission: Secondary | ICD-10-CM | POA: Diagnosis not present

## 2018-02-08 DIAGNOSIS — Z959 Presence of cardiac and vascular implant and graft, unspecified: Secondary | ICD-10-CM | POA: Diagnosis not present

## 2018-02-08 DIAGNOSIS — R42 Dizziness and giddiness: Secondary | ICD-10-CM | POA: Diagnosis not present

## 2018-02-08 DIAGNOSIS — K589 Irritable bowel syndrome without diarrhea: Secondary | ICD-10-CM | POA: Diagnosis not present

## 2018-02-08 DIAGNOSIS — K5900 Constipation, unspecified: Secondary | ICD-10-CM | POA: Diagnosis not present

## 2018-02-08 DIAGNOSIS — D61818 Other pancytopenia: Secondary | ICD-10-CM | POA: Diagnosis not present

## 2018-02-08 DIAGNOSIS — K219 Gastro-esophageal reflux disease without esophagitis: Secondary | ICD-10-CM | POA: Diagnosis not present

## 2018-02-08 DIAGNOSIS — I951 Orthostatic hypotension: Secondary | ICD-10-CM | POA: Diagnosis not present

## 2018-02-12 DIAGNOSIS — C9200 Acute myeloblastic leukemia, not having achieved remission: Secondary | ICD-10-CM | POA: Diagnosis not present

## 2018-02-12 DIAGNOSIS — Z79899 Other long term (current) drug therapy: Secondary | ICD-10-CM | POA: Diagnosis not present

## 2018-02-12 DIAGNOSIS — Z5181 Encounter for therapeutic drug level monitoring: Secondary | ICD-10-CM | POA: Diagnosis not present

## 2018-02-13 DIAGNOSIS — C9200 Acute myeloblastic leukemia, not having achieved remission: Secondary | ICD-10-CM | POA: Diagnosis not present

## 2018-02-13 DIAGNOSIS — Z5111 Encounter for antineoplastic chemotherapy: Secondary | ICD-10-CM | POA: Diagnosis not present

## 2018-02-13 DIAGNOSIS — Z79899 Other long term (current) drug therapy: Secondary | ICD-10-CM | POA: Diagnosis not present

## 2018-02-14 DIAGNOSIS — Z5111 Encounter for antineoplastic chemotherapy: Secondary | ICD-10-CM | POA: Diagnosis not present

## 2018-02-14 DIAGNOSIS — C9200 Acute myeloblastic leukemia, not having achieved remission: Secondary | ICD-10-CM | POA: Diagnosis not present

## 2018-02-14 DIAGNOSIS — Z79899 Other long term (current) drug therapy: Secondary | ICD-10-CM | POA: Diagnosis not present

## 2018-02-15 DIAGNOSIS — Z79899 Other long term (current) drug therapy: Secondary | ICD-10-CM | POA: Diagnosis not present

## 2018-02-15 DIAGNOSIS — C9200 Acute myeloblastic leukemia, not having achieved remission: Secondary | ICD-10-CM | POA: Diagnosis not present

## 2018-02-15 DIAGNOSIS — Z5111 Encounter for antineoplastic chemotherapy: Secondary | ICD-10-CM | POA: Diagnosis not present

## 2018-02-16 DIAGNOSIS — Z5111 Encounter for antineoplastic chemotherapy: Secondary | ICD-10-CM | POA: Diagnosis not present

## 2018-02-16 DIAGNOSIS — C9200 Acute myeloblastic leukemia, not having achieved remission: Secondary | ICD-10-CM | POA: Diagnosis not present

## 2018-02-16 DIAGNOSIS — Z79899 Other long term (current) drug therapy: Secondary | ICD-10-CM | POA: Diagnosis not present

## 2018-02-19 DIAGNOSIS — I10 Essential (primary) hypertension: Secondary | ICD-10-CM | POA: Diagnosis not present

## 2018-02-19 DIAGNOSIS — I2510 Atherosclerotic heart disease of native coronary artery without angina pectoris: Secondary | ICD-10-CM | POA: Diagnosis not present

## 2018-02-19 DIAGNOSIS — C9200 Acute myeloblastic leukemia, not having achieved remission: Secondary | ICD-10-CM | POA: Diagnosis not present

## 2018-02-19 DIAGNOSIS — R0602 Shortness of breath: Secondary | ICD-10-CM | POA: Diagnosis not present

## 2018-02-23 DIAGNOSIS — C9200 Acute myeloblastic leukemia, not having achieved remission: Secondary | ICD-10-CM | POA: Diagnosis not present

## 2018-02-26 DIAGNOSIS — C9200 Acute myeloblastic leukemia, not having achieved remission: Secondary | ICD-10-CM | POA: Diagnosis not present

## 2018-03-02 DIAGNOSIS — C9200 Acute myeloblastic leukemia, not having achieved remission: Secondary | ICD-10-CM | POA: Diagnosis not present

## 2018-03-02 DIAGNOSIS — Z79899 Other long term (current) drug therapy: Secondary | ICD-10-CM | POA: Diagnosis not present

## 2018-03-05 DIAGNOSIS — C9200 Acute myeloblastic leukemia, not having achieved remission: Secondary | ICD-10-CM | POA: Diagnosis not present

## 2018-03-08 DIAGNOSIS — C9200 Acute myeloblastic leukemia, not having achieved remission: Secondary | ICD-10-CM | POA: Diagnosis not present

## 2018-03-12 DIAGNOSIS — C9200 Acute myeloblastic leukemia, not having achieved remission: Secondary | ICD-10-CM | POA: Diagnosis not present

## 2018-03-15 DIAGNOSIS — Z79899 Other long term (current) drug therapy: Secondary | ICD-10-CM | POA: Diagnosis not present

## 2018-03-15 DIAGNOSIS — C9200 Acute myeloblastic leukemia, not having achieved remission: Secondary | ICD-10-CM | POA: Diagnosis not present

## 2018-03-19 DIAGNOSIS — C9200 Acute myeloblastic leukemia, not having achieved remission: Secondary | ICD-10-CM | POA: Diagnosis not present

## 2018-03-21 ENCOUNTER — Ambulatory Visit (INDEPENDENT_AMBULATORY_CARE_PROVIDER_SITE_OTHER): Payer: Medicare Other

## 2018-03-21 DIAGNOSIS — I442 Atrioventricular block, complete: Secondary | ICD-10-CM

## 2018-03-21 NOTE — Progress Notes (Signed)
Remote pacemaker transmission.   

## 2018-03-22 ENCOUNTER — Encounter: Payer: Self-pay | Admitting: Cardiology

## 2018-03-22 DIAGNOSIS — K5900 Constipation, unspecified: Secondary | ICD-10-CM | POA: Diagnosis not present

## 2018-03-22 DIAGNOSIS — I10 Essential (primary) hypertension: Secondary | ICD-10-CM | POA: Diagnosis not present

## 2018-03-22 DIAGNOSIS — I2510 Atherosclerotic heart disease of native coronary artery without angina pectoris: Secondary | ICD-10-CM | POA: Diagnosis not present

## 2018-03-22 DIAGNOSIS — K219 Gastro-esophageal reflux disease without esophagitis: Secondary | ICD-10-CM | POA: Diagnosis not present

## 2018-03-22 DIAGNOSIS — C9200 Acute myeloblastic leukemia, not having achieved remission: Secondary | ICD-10-CM | POA: Diagnosis not present

## 2018-03-22 DIAGNOSIS — E785 Hyperlipidemia, unspecified: Secondary | ICD-10-CM | POA: Diagnosis not present

## 2018-03-22 DIAGNOSIS — M549 Dorsalgia, unspecified: Secondary | ICD-10-CM | POA: Diagnosis not present

## 2018-03-26 DIAGNOSIS — C9200 Acute myeloblastic leukemia, not having achieved remission: Secondary | ICD-10-CM | POA: Diagnosis not present

## 2018-03-26 DIAGNOSIS — Z5181 Encounter for therapeutic drug level monitoring: Secondary | ICD-10-CM | POA: Diagnosis not present

## 2018-03-26 DIAGNOSIS — Z79899 Other long term (current) drug therapy: Secondary | ICD-10-CM | POA: Diagnosis not present

## 2018-03-26 DIAGNOSIS — Z5111 Encounter for antineoplastic chemotherapy: Secondary | ICD-10-CM | POA: Diagnosis not present

## 2018-03-27 DIAGNOSIS — Z5111 Encounter for antineoplastic chemotherapy: Secondary | ICD-10-CM | POA: Diagnosis not present

## 2018-03-27 DIAGNOSIS — Z79899 Other long term (current) drug therapy: Secondary | ICD-10-CM | POA: Diagnosis not present

## 2018-03-27 DIAGNOSIS — C9200 Acute myeloblastic leukemia, not having achieved remission: Secondary | ICD-10-CM | POA: Diagnosis not present

## 2018-03-29 DIAGNOSIS — Z5111 Encounter for antineoplastic chemotherapy: Secondary | ICD-10-CM | POA: Diagnosis not present

## 2018-03-29 DIAGNOSIS — C9200 Acute myeloblastic leukemia, not having achieved remission: Secondary | ICD-10-CM | POA: Diagnosis not present

## 2018-03-29 DIAGNOSIS — Z79899 Other long term (current) drug therapy: Secondary | ICD-10-CM | POA: Diagnosis not present

## 2018-03-30 DIAGNOSIS — C9200 Acute myeloblastic leukemia, not having achieved remission: Secondary | ICD-10-CM | POA: Diagnosis not present

## 2018-03-30 DIAGNOSIS — Z5111 Encounter for antineoplastic chemotherapy: Secondary | ICD-10-CM | POA: Diagnosis not present

## 2018-03-30 DIAGNOSIS — Z79899 Other long term (current) drug therapy: Secondary | ICD-10-CM | POA: Diagnosis not present

## 2018-04-02 DIAGNOSIS — C9200 Acute myeloblastic leukemia, not having achieved remission: Secondary | ICD-10-CM | POA: Diagnosis not present

## 2018-04-05 ENCOUNTER — Encounter: Payer: Self-pay | Admitting: Nurse Practitioner

## 2018-04-05 DIAGNOSIS — C9200 Acute myeloblastic leukemia, not having achieved remission: Secondary | ICD-10-CM | POA: Diagnosis not present

## 2018-04-08 LAB — CUP PACEART REMOTE DEVICE CHECK
Brady Statistic AP VP Percent: 14 %
Brady Statistic AP VS Percent: 1 %
Brady Statistic AS VS Percent: 1 %
Brady Statistic RV Percent Paced: 99 %
Implantable Lead Implant Date: 20090813
Implantable Lead Location: 753859
Lead Channel Impedance Value: 600 Ohm
Lead Channel Pacing Threshold Amplitude: 0.75 V
Lead Channel Pacing Threshold Pulse Width: 0.5 ms
Lead Channel Sensing Intrinsic Amplitude: 11.8 mV
Lead Channel Setting Pacing Amplitude: 2 V
Lead Channel Setting Pacing Amplitude: 2.5 V
Lead Channel Setting Pacing Pulse Width: 0.5 ms
Lead Channel Setting Sensing Sensitivity: 4 mV
MDC IDC LEAD IMPLANT DT: 20090813
MDC IDC LEAD LOCATION: 753860
MDC IDC MSMT BATTERY REMAINING LONGEVITY: 108 mo
MDC IDC MSMT BATTERY REMAINING PERCENTAGE: 95.5 %
MDC IDC MSMT BATTERY VOLTAGE: 3.01 V
MDC IDC MSMT LEADCHNL RA IMPEDANCE VALUE: 430 Ohm
MDC IDC MSMT LEADCHNL RA PACING THRESHOLD AMPLITUDE: 0.5 V
MDC IDC MSMT LEADCHNL RA PACING THRESHOLD PULSEWIDTH: 0.5 ms
MDC IDC MSMT LEADCHNL RA SENSING INTR AMPL: 3.6 mV
MDC IDC PG IMPLANT DT: 20180925
MDC IDC PG SERIAL: 8943792
MDC IDC SESS DTM: 20191218090015
MDC IDC STAT BRADY AS VP PERCENT: 86 %
MDC IDC STAT BRADY RA PERCENT PACED: 14 %

## 2018-04-09 DIAGNOSIS — Z79899 Other long term (current) drug therapy: Secondary | ICD-10-CM | POA: Diagnosis not present

## 2018-04-09 DIAGNOSIS — C9200 Acute myeloblastic leukemia, not having achieved remission: Secondary | ICD-10-CM | POA: Diagnosis not present

## 2018-04-12 DIAGNOSIS — C9200 Acute myeloblastic leukemia, not having achieved remission: Secondary | ICD-10-CM | POA: Diagnosis not present

## 2018-04-16 DIAGNOSIS — C9200 Acute myeloblastic leukemia, not having achieved remission: Secondary | ICD-10-CM | POA: Diagnosis not present

## 2018-04-19 DIAGNOSIS — C9200 Acute myeloblastic leukemia, not having achieved remission: Secondary | ICD-10-CM | POA: Diagnosis not present

## 2018-04-24 DIAGNOSIS — C9200 Acute myeloblastic leukemia, not having achieved remission: Secondary | ICD-10-CM | POA: Diagnosis not present

## 2018-04-27 DIAGNOSIS — C9200 Acute myeloblastic leukemia, not having achieved remission: Secondary | ICD-10-CM | POA: Diagnosis not present

## 2018-04-30 DIAGNOSIS — C9200 Acute myeloblastic leukemia, not having achieved remission: Secondary | ICD-10-CM | POA: Diagnosis not present

## 2018-05-01 NOTE — Progress Notes (Signed)
Electrophysiology Office Note Date: 05/02/2018  ID:  Jamie Reese, DOB 1933/09/25, MRN [id number]  PCP: Prince Solian, MD Primary Cardiologist: Hochrein Electrophysiologist: Allred  CC: Pacemaker follow-up  Jamie Reese is a 85 y.o. female seen today for Dr Rayann Heman.  She presents today for routine electrophysiology followup.  Since last being seen in our clinic, the patient reports doing relatively well.  She is being managed at [facility] for leukemia.  She denies chest pain, palpitations, dyspnea, PND, orthopnea, nausea, vomiting, dizziness, syncope, edema, weight gain, or early satiety.  Device History: STJ dual chamber PPM implanted 2009 for complete heart block    Past Medical History:  Diagnosis Date  . Arthritis    R shoulder, both knees & low back- degenerative   . Asthma    as a child  . Complete heart block (HCC)    s/p STJ dual chamber PPM   . Complication of anesthesia 2011   told that it as hard to pass tube when they intubated for pacemaker insertion   . Coronary artery disease    s/p PCI  . Family history of adverse reaction to anesthesia    father hard to put to sleep but he also used a lot alcohol    . Giant cell arteritis (HCC)    polymyalgia rheumatica  . H/O: hysterectomy   . History of hiatal hernia   . Hyperlipidemia   . Hypertension    not treated with med.   . Irritable bowel syndrome   . Leukemia, acute myeloid ([place])   . Polymyalgia rheumatica ([place])   . S/P breast biopsy    secondary to nipple discharge   Past Surgical History:  Procedure Laterality Date  . ABDOMINAL HYSTERECTOMY    . APPENDECTOMY    . BREAST BIOPSY     secondary to nipple discharge  . CARDIAC CATHETERIZATION  2001   x1stent - Alfred Little   . CESAREAN SECTION    . CHOLECYSTECTOMY    . EYE SURGERY     cataracts removed & IOL- both eyes   . INSERT / REPLACE / REMOVE PACEMAKER    . KNEE ARTHROSCOPY Left   . LUMBAR LAMINECTOMY/DECOMPRESSION MICRODISCECTOMY  Left 06/08/2015   Procedure: Left Lumbar Two-Three Microdiskectomy;  Surgeon: Kary Kos, MD;  Location: [place] [facility];  Service: Neurosurgery;  Laterality: Left;  Left L2-3 Microdiskectomy  . PACEMAKER INSERTION  11/15/2007   St. Jude Pacemaker Zephyr XL implanted in [place]  . pilondial cyst resection    . PPM GENERATOR CHANGEOUT N/A 12/27/2016   SJM Assurity MRI generator change by Dr Rayann Heman  . TEMPORAL ARTERY BIOPSY / LIGATION    . TONSILLECTOMY      Current Outpatient Medications  Medication Sig Dispense Refill  . atorvastatin (LIPITOR) 20 MG tablet Take 1 tablet (20 mg total) by mouth daily at 6 PM.    . B Complex-C (B-COMPLEX WITH VITAMIN C) tablet Take 1 tablet by mouth daily.    . fluconazole (DIFLUCAN) 200 MG tablet Take 1 tablet by mouth daily. Take while neuropenic until advised to stop by your oncologist    . pantoprazole (PROTONIX) 40 MG tablet Take 1 tablet by mouth daily as needed for heartburn.    . polyethylene glycol (MIRALAX / GLYCOLAX) packet Take 17 g by mouth daily as needed for moderate constipation or severe constipation. 14 each 0  . traMADol (ULTRAM) 50 MG tablet Take 1-2 tablets by mouth daily as needed for pain.    Marland Kitchen  VENCLEXTA 100 MG TABS Take 2 tablets by mouth daily. Takes everyday for 3 weeks, then off for 3 weeks    . Vitamin D, Ergocalciferol, (DRISDOL) 50000 units CAPS capsule Take 50,000 Units by mouth every 7 (seven) days.   1   No current facility-administered medications for this visit.     Allergies:   Hydrocodone; Codeine; Cymbalta [duloxetine hcl]; and Lyrica [pregabalin]   Social History: Social History   Socioeconomic History  . Marital status: Married    Spouse name: Not on file  . Number of children: Not on file  . Years of education: Not on file  . Highest education level: Not on file  Occupational History  . Not on file  Social Needs  . Financial resource strain: Not on file  . Food insecurity:    Worry: Not on file    Inability: Not on  file  . Transportation needs:    Medical: Not on file    Non-medical: Not on file  Tobacco Use  . Smoking status: Never Smoker  . Smokeless tobacco: Never Used  Substance and Sexual Activity  . Alcohol use: No  . Drug use: No  . Sexual activity: Not on file  Lifestyle  . Physical activity:    Days per week: Not on file    Minutes per session: Not on file  . Stress: Not on file  Relationships  . Social connections:    Talks on phone: Not on file    Gets together: Not on file    Attends religious service: Not on file    Active member of club or organization: Not on file    Attends meetings of clubs or organizations: Not on file    Relationship status: Not on file  . Intimate partner violence:    Fear of current or ex partner: Not on file    Emotionally abused: Not on file    Physically abused: Not on file    Forced sexual activity: Not on file  Other Topics Concern  . Not on file  Social History Narrative  . Not on file    Family History: Family History  Problem Relation Age of Onset  . Heart attack Father 34  . Heart Problems Mother        PPM  . Cancer Other      Review of Systems: All other systems reviewed and are otherwise negative except as noted above.   Physical Exam: VS:  BP 130/68   Pulse 89   Ht 5' (1.524 m)   Wt 119 lb 9.6 oz (54.3 kg)   SpO2 90%   BMI 23.36 kg/m  , BMI Body mass index is 23.36 kg/m.  GEN- The patient is elderly and frail appearing, alert and oriented x 3 today.   HEENT: normocephalic, atraumatic; sclera clear, conjunctiva pink; hearing intact; oropharynx clear; neck supple  Lungs- Clear to ausculation bilaterally, normal work of breathing.  No wheezes, rales, rhonchi Heart- Regular rate and rhythm (paced) GI- soft, non-tender, non-distended, bowel sounds present  Extremities- no clubbing, cyanosis, or edema  MS- no significant deformity or atrophy Skin- warm and dry, no rash or lesion; PPM pocket well healed Psych-  euthymic mood, full affect Neuro- strength and sensation are intact  PPM Interrogation- reviewed in detail today,  See PACEART report  EKG:  EKG is not ordered today.  Recent Labs: 07/10/2017: ALT 26 07/11/2017: BUN 17; Creatinine, Ser 0.74; Potassium 3.8; Sodium 139 07/13/2017: Hemoglobin 10.0; Platelets 20  Wt Readings from Last 3 Encounters:  05/02/18 119 lb 9.6 oz (54.3 kg)  12/22/17 120 lb (54.4 kg)  11/23/17 125 lb (56.7 kg)     Other studies Reviewed: Additional studies/ records that were reviewed today include: Dr Jackalyn Lombard office notes  Assessment and Plan:  1.  Complete heart block Normal PPM function See Pace Art report No changes today   2.  HTN Stable No change required today  3.  CAD No recent ischemic symptoms Continue current therapy    Current medicines are reviewed at length with the patient today.   The patient does not have concerns regarding her medicines.  The following changes were made today:  none  Labs/ tests ordered today include: none Orders Placed This Encounter  Procedures  . CUP PACEART INCLINIC DEVICE CHECK     Disposition:   Follow up with [facility], Dr Percival Spanish as scheduled, Dr Rayann Heman 1 year     Signed, Chanetta Marshall, NP 05/02/2018 10:10 AM  [facility] [address] [place] [address] [phone] (office) [phone] (fax)

## 2018-05-02 ENCOUNTER — Encounter: Payer: Self-pay | Admitting: Nurse Practitioner

## 2018-05-02 ENCOUNTER — Ambulatory Visit (INDEPENDENT_AMBULATORY_CARE_PROVIDER_SITE_OTHER): Payer: Medicare Other | Admitting: Nurse Practitioner

## 2018-05-02 VITALS — BP 130/68 | HR 89 | Ht 60.0 in | Wt 119.6 lb

## 2018-05-02 DIAGNOSIS — I959 Hypotension, unspecified: Secondary | ICD-10-CM | POA: Diagnosis not present

## 2018-05-02 DIAGNOSIS — I2510 Atherosclerotic heart disease of native coronary artery without angina pectoris: Secondary | ICD-10-CM

## 2018-05-02 DIAGNOSIS — I442 Atrioventricular block, complete: Secondary | ICD-10-CM

## 2018-05-02 LAB — CUP PACEART INCLINIC DEVICE CHECK
Implantable Lead Implant Date: 20090813
Implantable Lead Location: 753860
MDC IDC LEAD IMPLANT DT: 20090813
MDC IDC LEAD LOCATION: 753859
MDC IDC PG IMPLANT DT: 20180925
MDC IDC SESS DTM: 20200129095523
Pulse Gen Model: 2272
Pulse Gen Serial Number: 8943792

## 2018-05-02 NOTE — Patient Instructions (Signed)
Medication Instructions:  none If you need a refill on your cardiac medications before your next appointment, please call your pharmacy.   Lab work: none If you have labs (blood work) drawn today and your tests are completely normal, you will receive your results only by: Marland Kitchen MyChart Message (if you have MyChart) OR . A paper copy in the mail If you have any lab test that is abnormal or we need to change your treatment, we will call you to review the results.  Testing/Procedures: none  Follow-Up: At [facility], you and your health needs are our priority.  As part of our continuing mission to provide you with exceptional heart care, we have created designated Provider Care Teams.  These Care Teams include your primary Cardiologist (physician) and Advanced Practice Providers (APPs -  Physician Assistants and Nurse Practitioners) who all work together to provide you with the care you need, when you need it. You will need a follow up appointment in 1 years.  Please call our office 2 months in advance to schedule this appointment.  You may see Dr Rayann Heman or one of the following Advanced Practice Providers on your designated Care Team:   Chanetta Marshall, NP . Tommye Standard, PA-C  Any Other Special Instructions Will Be Listed Below (If Applicable). Remote monitoring is used to monitor your Pacemaker  from home. This monitoring reduces the number of office visits required to check your device to one time per year. It allows [place] to keep an eye on the functioning of your device to ensure it is working properly. You are scheduled for a device check from home on 06/20/18. You may send your transmission at any time that day. If you have a wireless device, the transmission will be sent automatically. After your physician reviews your transmission, you will receive a postcard with your next transmission date.

## 2018-05-03 DIAGNOSIS — K219 Gastro-esophageal reflux disease without esophagitis: Secondary | ICD-10-CM | POA: Diagnosis not present

## 2018-05-03 DIAGNOSIS — M549 Dorsalgia, unspecified: Secondary | ICD-10-CM | POA: Diagnosis not present

## 2018-05-03 DIAGNOSIS — G8929 Other chronic pain: Secondary | ICD-10-CM | POA: Diagnosis not present

## 2018-05-03 DIAGNOSIS — R600 Localized edema: Secondary | ICD-10-CM | POA: Diagnosis not present

## 2018-05-03 DIAGNOSIS — C9200 Acute myeloblastic leukemia, not having achieved remission: Secondary | ICD-10-CM | POA: Diagnosis not present

## 2018-05-03 DIAGNOSIS — R0602 Shortness of breath: Secondary | ICD-10-CM | POA: Diagnosis not present

## 2018-05-03 DIAGNOSIS — I951 Orthostatic hypotension: Secondary | ICD-10-CM | POA: Diagnosis not present

## 2018-05-03 DIAGNOSIS — E785 Hyperlipidemia, unspecified: Secondary | ICD-10-CM | POA: Diagnosis not present

## 2018-05-07 DIAGNOSIS — Z5181 Encounter for therapeutic drug level monitoring: Secondary | ICD-10-CM | POA: Diagnosis not present

## 2018-05-07 DIAGNOSIS — Z79899 Other long term (current) drug therapy: Secondary | ICD-10-CM | POA: Diagnosis not present

## 2018-05-07 DIAGNOSIS — Z5111 Encounter for antineoplastic chemotherapy: Secondary | ICD-10-CM | POA: Diagnosis not present

## 2018-05-07 DIAGNOSIS — C9200 Acute myeloblastic leukemia, not having achieved remission: Secondary | ICD-10-CM | POA: Diagnosis not present

## 2018-05-08 DIAGNOSIS — Z5111 Encounter for antineoplastic chemotherapy: Secondary | ICD-10-CM | POA: Diagnosis not present

## 2018-05-08 DIAGNOSIS — C9200 Acute myeloblastic leukemia, not having achieved remission: Secondary | ICD-10-CM | POA: Diagnosis not present

## 2018-05-08 DIAGNOSIS — Z79899 Other long term (current) drug therapy: Secondary | ICD-10-CM | POA: Diagnosis not present

## 2018-05-09 DIAGNOSIS — Z5111 Encounter for antineoplastic chemotherapy: Secondary | ICD-10-CM | POA: Diagnosis not present

## 2018-05-09 DIAGNOSIS — C9200 Acute myeloblastic leukemia, not having achieved remission: Secondary | ICD-10-CM | POA: Diagnosis not present

## 2018-05-09 DIAGNOSIS — Z79899 Other long term (current) drug therapy: Secondary | ICD-10-CM | POA: Diagnosis not present

## 2018-05-10 DIAGNOSIS — Z79899 Other long term (current) drug therapy: Secondary | ICD-10-CM | POA: Diagnosis not present

## 2018-05-10 DIAGNOSIS — Z5111 Encounter for antineoplastic chemotherapy: Secondary | ICD-10-CM | POA: Diagnosis not present

## 2018-05-10 DIAGNOSIS — C9200 Acute myeloblastic leukemia, not having achieved remission: Secondary | ICD-10-CM | POA: Diagnosis not present

## 2018-05-10 DIAGNOSIS — Z5181 Encounter for therapeutic drug level monitoring: Secondary | ICD-10-CM | POA: Diagnosis not present

## 2018-05-17 DIAGNOSIS — C9200 Acute myeloblastic leukemia, not having achieved remission: Secondary | ICD-10-CM | POA: Diagnosis not present

## 2018-05-21 DIAGNOSIS — C9200 Acute myeloblastic leukemia, not having achieved remission: Secondary | ICD-10-CM | POA: Diagnosis not present

## 2018-05-21 DIAGNOSIS — Z79899 Other long term (current) drug therapy: Secondary | ICD-10-CM | POA: Diagnosis not present

## 2018-05-24 DIAGNOSIS — C9200 Acute myeloblastic leukemia, not having achieved remission: Secondary | ICD-10-CM | POA: Diagnosis not present

## 2018-05-31 DIAGNOSIS — C9200 Acute myeloblastic leukemia, not having achieved remission: Secondary | ICD-10-CM | POA: Diagnosis not present

## 2018-06-07 DIAGNOSIS — C9200 Acute myeloblastic leukemia, not having achieved remission: Secondary | ICD-10-CM | POA: Diagnosis not present

## 2018-06-14 DIAGNOSIS — E785 Hyperlipidemia, unspecified: Secondary | ICD-10-CM | POA: Diagnosis not present

## 2018-06-14 DIAGNOSIS — R600 Localized edema: Secondary | ICD-10-CM | POA: Diagnosis not present

## 2018-06-14 DIAGNOSIS — K219 Gastro-esophageal reflux disease without esophagitis: Secondary | ICD-10-CM | POA: Diagnosis not present

## 2018-06-14 DIAGNOSIS — G8929 Other chronic pain: Secondary | ICD-10-CM | POA: Diagnosis not present

## 2018-06-14 DIAGNOSIS — M549 Dorsalgia, unspecified: Secondary | ICD-10-CM | POA: Diagnosis not present

## 2018-06-14 DIAGNOSIS — C9200 Acute myeloblastic leukemia, not having achieved remission: Secondary | ICD-10-CM | POA: Diagnosis not present

## 2018-06-14 DIAGNOSIS — I2510 Atherosclerotic heart disease of native coronary artery without angina pectoris: Secondary | ICD-10-CM | POA: Diagnosis not present

## 2018-06-14 DIAGNOSIS — K5900 Constipation, unspecified: Secondary | ICD-10-CM | POA: Diagnosis not present

## 2018-06-14 DIAGNOSIS — I10 Essential (primary) hypertension: Secondary | ICD-10-CM | POA: Diagnosis not present

## 2018-06-18 DIAGNOSIS — R0602 Shortness of breath: Secondary | ICD-10-CM | POA: Diagnosis not present

## 2018-06-18 DIAGNOSIS — Z5111 Encounter for antineoplastic chemotherapy: Secondary | ICD-10-CM | POA: Diagnosis not present

## 2018-06-18 DIAGNOSIS — C9200 Acute myeloblastic leukemia, not having achieved remission: Secondary | ICD-10-CM | POA: Diagnosis not present

## 2018-06-18 DIAGNOSIS — Z79899 Other long term (current) drug therapy: Secondary | ICD-10-CM | POA: Diagnosis not present

## 2018-06-19 DIAGNOSIS — Z5111 Encounter for antineoplastic chemotherapy: Secondary | ICD-10-CM | POA: Diagnosis not present

## 2018-06-19 DIAGNOSIS — C9200 Acute myeloblastic leukemia, not having achieved remission: Secondary | ICD-10-CM | POA: Diagnosis not present

## 2018-06-19 DIAGNOSIS — Z79899 Other long term (current) drug therapy: Secondary | ICD-10-CM | POA: Diagnosis not present

## 2018-06-20 ENCOUNTER — Ambulatory Visit (INDEPENDENT_AMBULATORY_CARE_PROVIDER_SITE_OTHER): Payer: Medicare Other | Admitting: *Deleted

## 2018-06-20 ENCOUNTER — Other Ambulatory Visit: Payer: Self-pay

## 2018-06-20 DIAGNOSIS — C9200 Acute myeloblastic leukemia, not having achieved remission: Secondary | ICD-10-CM | POA: Diagnosis not present

## 2018-06-20 DIAGNOSIS — I442 Atrioventricular block, complete: Secondary | ICD-10-CM

## 2018-06-20 DIAGNOSIS — Z5111 Encounter for antineoplastic chemotherapy: Secondary | ICD-10-CM | POA: Diagnosis not present

## 2018-06-20 DIAGNOSIS — Z79899 Other long term (current) drug therapy: Secondary | ICD-10-CM | POA: Diagnosis not present

## 2018-06-21 ENCOUNTER — Telehealth: Payer: Self-pay

## 2018-06-21 DIAGNOSIS — Z79899 Other long term (current) drug therapy: Secondary | ICD-10-CM | POA: Diagnosis not present

## 2018-06-21 DIAGNOSIS — C9200 Acute myeloblastic leukemia, not having achieved remission: Secondary | ICD-10-CM | POA: Diagnosis not present

## 2018-06-21 DIAGNOSIS — Z5111 Encounter for antineoplastic chemotherapy: Secondary | ICD-10-CM | POA: Diagnosis not present

## 2018-06-21 DIAGNOSIS — Z5181 Encounter for therapeutic drug level monitoring: Secondary | ICD-10-CM | POA: Diagnosis not present

## 2018-06-21 LAB — CUP PACEART REMOTE DEVICE CHECK
Battery Remaining Percentage: 95.5 %
Battery Voltage: 3.01 V
Brady Statistic AP VP Percent: 13 %
Brady Statistic AP VS Percent: 1 %
Brady Statistic AS VS Percent: 1 %
Brady Statistic RV Percent Paced: 99 %
Implantable Lead Implant Date: 20090813
Implantable Lead Implant Date: 20090813
Implantable Lead Location: 753859
Implantable Pulse Generator Implant Date: 20180925
Lead Channel Impedance Value: 580 Ohm
Lead Channel Pacing Threshold Amplitude: 0.5 V
Lead Channel Pacing Threshold Amplitude: 1 V
Lead Channel Pacing Threshold Pulse Width: 0.5 ms
Lead Channel Sensing Intrinsic Amplitude: 3.1 mV
Lead Channel Setting Pacing Amplitude: 2.5 V
Lead Channel Setting Pacing Pulse Width: 0.5 ms
MDC IDC LEAD LOCATION: 753860
MDC IDC MSMT BATTERY REMAINING LONGEVITY: 108 mo
MDC IDC MSMT LEADCHNL RA IMPEDANCE VALUE: 430 Ohm
MDC IDC MSMT LEADCHNL RA PACING THRESHOLD PULSEWIDTH: 0.5 ms
MDC IDC MSMT LEADCHNL RV SENSING INTR AMPL: 12 mV
MDC IDC PG SERIAL: 8943792
MDC IDC SESS DTM: 20200319120615
MDC IDC SET LEADCHNL RA PACING AMPLITUDE: 2 V
MDC IDC SET LEADCHNL RV SENSING SENSITIVITY: 4 mV
MDC IDC STAT BRADY AS VP PERCENT: 87 %
MDC IDC STAT BRADY RA PERCENT PACED: 13 %

## 2018-06-21 NOTE — Telephone Encounter (Signed)
Left message for patient to remind of missed remote transmission.  

## 2018-06-28 ENCOUNTER — Encounter: Payer: Self-pay | Admitting: Cardiology

## 2018-06-28 DIAGNOSIS — C9200 Acute myeloblastic leukemia, not having achieved remission: Secondary | ICD-10-CM | POA: Diagnosis not present

## 2018-06-28 NOTE — Progress Notes (Signed)
Remote pacemaker transmission.   

## 2018-07-02 DIAGNOSIS — Z79899 Other long term (current) drug therapy: Secondary | ICD-10-CM | POA: Diagnosis not present

## 2018-07-02 DIAGNOSIS — C9200 Acute myeloblastic leukemia, not having achieved remission: Secondary | ICD-10-CM | POA: Diagnosis not present

## 2018-07-02 DIAGNOSIS — D709 Neutropenia, unspecified: Secondary | ICD-10-CM | POA: Diagnosis not present

## 2018-07-02 DIAGNOSIS — T8039XA Other ABO incompatibility reaction due to transfusion of blood or blood products, initial encounter: Secondary | ICD-10-CM | POA: Diagnosis not present

## 2018-07-05 DIAGNOSIS — C9200 Acute myeloblastic leukemia, not having achieved remission: Secondary | ICD-10-CM | POA: Diagnosis not present

## 2018-07-12 DIAGNOSIS — C9200 Acute myeloblastic leukemia, not having achieved remission: Secondary | ICD-10-CM | POA: Diagnosis not present

## 2018-07-16 ENCOUNTER — Encounter: Payer: Self-pay | Admitting: *Deleted

## 2018-07-16 ENCOUNTER — Other Ambulatory Visit: Payer: Self-pay | Admitting: *Deleted

## 2018-07-16 NOTE — Patient Outreach (Signed)
Call back from patient. RNCM discussed [facility] care management program and that a nurse would be calling patient regarding her health.  Patient denies any needs, stating "I am well taken care of at this time". Plan to update Tier 5 list and will mail successful outreach letter with magnet. Plan to sign off. Royetta Crochet. [name], MSN, RN, [employer] , [place] [phone]) Toll Free Office

## 2018-07-16 NOTE — Patient Outreach (Signed)
Outreach call for Tier 5 screening No answer, HIPAA compliant voicemail left with RNCM contact requesting call. Plan to await call back, if none received will call again later in week. Jamie Reese. [name], MSN, RN, [employer] , [place] [phone]) Business Cell  [phone]) Toll Free Office

## 2018-07-19 DIAGNOSIS — C9200 Acute myeloblastic leukemia, not having achieved remission: Secondary | ICD-10-CM | POA: Diagnosis not present

## 2018-07-26 DIAGNOSIS — I951 Orthostatic hypotension: Secondary | ICD-10-CM | POA: Diagnosis not present

## 2018-07-26 DIAGNOSIS — E785 Hyperlipidemia, unspecified: Secondary | ICD-10-CM | POA: Diagnosis not present

## 2018-07-26 DIAGNOSIS — R0609 Other forms of dyspnea: Secondary | ICD-10-CM | POA: Diagnosis not present

## 2018-07-26 DIAGNOSIS — I10 Essential (primary) hypertension: Secondary | ICD-10-CM | POA: Diagnosis not present

## 2018-07-26 DIAGNOSIS — K219 Gastro-esophageal reflux disease without esophagitis: Secondary | ICD-10-CM | POA: Diagnosis not present

## 2018-07-26 DIAGNOSIS — R42 Dizziness and giddiness: Secondary | ICD-10-CM | POA: Diagnosis not present

## 2018-07-26 DIAGNOSIS — I2510 Atherosclerotic heart disease of native coronary artery without angina pectoris: Secondary | ICD-10-CM | POA: Diagnosis not present

## 2018-07-26 DIAGNOSIS — K589 Irritable bowel syndrome without diarrhea: Secondary | ICD-10-CM | POA: Diagnosis not present

## 2018-07-26 DIAGNOSIS — M549 Dorsalgia, unspecified: Secondary | ICD-10-CM | POA: Diagnosis not present

## 2018-07-26 DIAGNOSIS — G8929 Other chronic pain: Secondary | ICD-10-CM | POA: Diagnosis not present

## 2018-07-26 DIAGNOSIS — C9200 Acute myeloblastic leukemia, not having achieved remission: Secondary | ICD-10-CM | POA: Diagnosis not present

## 2018-07-26 DIAGNOSIS — K5900 Constipation, unspecified: Secondary | ICD-10-CM | POA: Diagnosis not present

## 2018-07-30 DIAGNOSIS — Z5111 Encounter for antineoplastic chemotherapy: Secondary | ICD-10-CM | POA: Diagnosis not present

## 2018-07-30 DIAGNOSIS — C9200 Acute myeloblastic leukemia, not having achieved remission: Secondary | ICD-10-CM | POA: Diagnosis not present

## 2018-07-30 DIAGNOSIS — Z79899 Other long term (current) drug therapy: Secondary | ICD-10-CM | POA: Diagnosis not present

## 2018-07-31 DIAGNOSIS — C9200 Acute myeloblastic leukemia, not having achieved remission: Secondary | ICD-10-CM | POA: Diagnosis not present

## 2018-07-31 DIAGNOSIS — Z79899 Other long term (current) drug therapy: Secondary | ICD-10-CM | POA: Diagnosis not present

## 2018-07-31 DIAGNOSIS — Z5111 Encounter for antineoplastic chemotherapy: Secondary | ICD-10-CM | POA: Diagnosis not present

## 2018-08-01 DIAGNOSIS — C9200 Acute myeloblastic leukemia, not having achieved remission: Secondary | ICD-10-CM | POA: Diagnosis not present

## 2018-08-01 DIAGNOSIS — Z79899 Other long term (current) drug therapy: Secondary | ICD-10-CM | POA: Diagnosis not present

## 2018-08-01 DIAGNOSIS — Z5111 Encounter for antineoplastic chemotherapy: Secondary | ICD-10-CM | POA: Diagnosis not present

## 2018-08-02 DIAGNOSIS — Z5111 Encounter for antineoplastic chemotherapy: Secondary | ICD-10-CM | POA: Diagnosis not present

## 2018-08-02 DIAGNOSIS — Z79899 Other long term (current) drug therapy: Secondary | ICD-10-CM | POA: Diagnosis not present

## 2018-08-02 DIAGNOSIS — C9200 Acute myeloblastic leukemia, not having achieved remission: Secondary | ICD-10-CM | POA: Diagnosis not present

## 2018-08-02 DIAGNOSIS — I2510 Atherosclerotic heart disease of native coronary artery without angina pectoris: Secondary | ICD-10-CM | POA: Diagnosis not present

## 2018-08-09 DIAGNOSIS — C9200 Acute myeloblastic leukemia, not having achieved remission: Secondary | ICD-10-CM | POA: Diagnosis not present

## 2018-08-13 DIAGNOSIS — C9200 Acute myeloblastic leukemia, not having achieved remission: Secondary | ICD-10-CM | POA: Diagnosis not present

## 2018-08-16 DIAGNOSIS — C9200 Acute myeloblastic leukemia, not having achieved remission: Secondary | ICD-10-CM | POA: Diagnosis not present

## 2018-08-23 DIAGNOSIS — C9200 Acute myeloblastic leukemia, not having achieved remission: Secondary | ICD-10-CM | POA: Diagnosis not present

## 2018-08-30 DIAGNOSIS — C9200 Acute myeloblastic leukemia, not having achieved remission: Secondary | ICD-10-CM | POA: Diagnosis not present

## 2018-09-06 DIAGNOSIS — R21 Rash and other nonspecific skin eruption: Secondary | ICD-10-CM | POA: Diagnosis not present

## 2018-09-06 DIAGNOSIS — E785 Hyperlipidemia, unspecified: Secondary | ICD-10-CM | POA: Diagnosis not present

## 2018-09-06 DIAGNOSIS — M545 Low back pain: Secondary | ICD-10-CM | POA: Diagnosis not present

## 2018-09-06 DIAGNOSIS — C9200 Acute myeloblastic leukemia, not having achieved remission: Secondary | ICD-10-CM | POA: Diagnosis not present

## 2018-09-06 DIAGNOSIS — Z95828 Presence of other vascular implants and grafts: Secondary | ICD-10-CM | POA: Diagnosis not present

## 2018-09-06 DIAGNOSIS — K219 Gastro-esophageal reflux disease without esophagitis: Secondary | ICD-10-CM | POA: Diagnosis not present

## 2018-09-06 DIAGNOSIS — K581 Irritable bowel syndrome with constipation: Secondary | ICD-10-CM | POA: Diagnosis not present

## 2018-09-10 DIAGNOSIS — Z5111 Encounter for antineoplastic chemotherapy: Secondary | ICD-10-CM | POA: Diagnosis not present

## 2018-09-10 DIAGNOSIS — C9200 Acute myeloblastic leukemia, not having achieved remission: Secondary | ICD-10-CM | POA: Diagnosis not present

## 2018-09-10 DIAGNOSIS — Z79899 Other long term (current) drug therapy: Secondary | ICD-10-CM | POA: Diagnosis not present

## 2018-09-11 DIAGNOSIS — Z79899 Other long term (current) drug therapy: Secondary | ICD-10-CM | POA: Diagnosis not present

## 2018-09-11 DIAGNOSIS — Z5111 Encounter for antineoplastic chemotherapy: Secondary | ICD-10-CM | POA: Diagnosis not present

## 2018-09-11 DIAGNOSIS — C9200 Acute myeloblastic leukemia, not having achieved remission: Secondary | ICD-10-CM | POA: Diagnosis not present

## 2018-09-12 DIAGNOSIS — Z5111 Encounter for antineoplastic chemotherapy: Secondary | ICD-10-CM | POA: Diagnosis not present

## 2018-09-12 DIAGNOSIS — Z79899 Other long term (current) drug therapy: Secondary | ICD-10-CM | POA: Diagnosis not present

## 2018-09-12 DIAGNOSIS — C9200 Acute myeloblastic leukemia, not having achieved remission: Secondary | ICD-10-CM | POA: Diagnosis not present

## 2018-09-13 DIAGNOSIS — C9200 Acute myeloblastic leukemia, not having achieved remission: Secondary | ICD-10-CM | POA: Diagnosis not present

## 2018-09-13 DIAGNOSIS — Z79899 Other long term (current) drug therapy: Secondary | ICD-10-CM | POA: Diagnosis not present

## 2018-09-13 DIAGNOSIS — Z5111 Encounter for antineoplastic chemotherapy: Secondary | ICD-10-CM | POA: Diagnosis not present

## 2018-09-13 DIAGNOSIS — R791 Abnormal coagulation profile: Secondary | ICD-10-CM | POA: Diagnosis not present

## 2018-09-19 ENCOUNTER — Ambulatory Visit (INDEPENDENT_AMBULATORY_CARE_PROVIDER_SITE_OTHER): Payer: Medicare Other | Admitting: *Deleted

## 2018-09-19 DIAGNOSIS — I442 Atrioventricular block, complete: Secondary | ICD-10-CM

## 2018-09-20 ENCOUNTER — Telehealth: Payer: Self-pay

## 2018-09-20 DIAGNOSIS — T82898D Other specified complication of vascular prosthetic devices, implants and grafts, subsequent encounter: Secondary | ICD-10-CM | POA: Diagnosis not present

## 2018-09-20 DIAGNOSIS — C9200 Acute myeloblastic leukemia, not having achieved remission: Secondary | ICD-10-CM | POA: Diagnosis not present

## 2018-09-20 NOTE — Telephone Encounter (Signed)
Left message for patient to remind of missed remote transmission.  

## 2018-09-24 LAB — CUP PACEART REMOTE DEVICE CHECK
Battery Remaining Longevity: 107 mo
Battery Remaining Percentage: 95.5 %
Battery Voltage: 2.99 V
Brady Statistic AP VP Percent: 22 %
Brady Statistic AP VS Percent: 1 %
Brady Statistic AS VP Percent: 77 %
Brady Statistic AS VS Percent: 1 %
Brady Statistic RA Percent Paced: 22 %
Brady Statistic RV Percent Paced: 99 %
Date Time Interrogation Session: 20200621060641
Implantable Lead Implant Date: 20090813
Implantable Lead Implant Date: 20090813
Implantable Lead Location: 753859
Implantable Lead Location: 753860
Implantable Pulse Generator Implant Date: 20180925
Lead Channel Impedance Value: 410 Ohm
Lead Channel Impedance Value: 560 Ohm
Lead Channel Pacing Threshold Amplitude: 0.5 V
Lead Channel Pacing Threshold Amplitude: 1 V
Lead Channel Pacing Threshold Pulse Width: 0.5 ms
Lead Channel Pacing Threshold Pulse Width: 0.5 ms
Lead Channel Sensing Intrinsic Amplitude: 12 mV
Lead Channel Sensing Intrinsic Amplitude: 3.1 mV
Lead Channel Setting Pacing Amplitude: 2 V
Lead Channel Setting Pacing Amplitude: 2.5 V
Lead Channel Setting Pacing Pulse Width: 0.5 ms
Lead Channel Setting Sensing Sensitivity: 4 mV
Pulse Gen Model: 2272
Pulse Gen Serial Number: 8943792

## 2018-09-27 DIAGNOSIS — C9200 Acute myeloblastic leukemia, not having achieved remission: Secondary | ICD-10-CM | POA: Diagnosis not present

## 2018-10-03 ENCOUNTER — Encounter: Payer: Self-pay | Admitting: Cardiology

## 2018-10-03 NOTE — Progress Notes (Signed)
Remote pacemaker transmission.   

## 2018-10-04 DIAGNOSIS — C9200 Acute myeloblastic leukemia, not having achieved remission: Secondary | ICD-10-CM | POA: Diagnosis not present

## 2018-10-11 DIAGNOSIS — I442 Atrioventricular block, complete: Secondary | ICD-10-CM | POA: Diagnosis not present

## 2018-10-11 DIAGNOSIS — M316 Other giant cell arteritis: Secondary | ICD-10-CM | POA: Diagnosis not present

## 2018-10-11 DIAGNOSIS — Z79899 Other long term (current) drug therapy: Secondary | ICD-10-CM | POA: Diagnosis not present

## 2018-10-11 DIAGNOSIS — C9200 Acute myeloblastic leukemia, not having achieved remission: Secondary | ICD-10-CM | POA: Diagnosis not present

## 2018-10-11 DIAGNOSIS — I2510 Atherosclerotic heart disease of native coronary artery without angina pectoris: Secondary | ICD-10-CM | POA: Diagnosis not present

## 2018-10-11 DIAGNOSIS — Z452 Encounter for adjustment and management of vascular access device: Secondary | ICD-10-CM | POA: Diagnosis not present

## 2018-10-18 DIAGNOSIS — K5900 Constipation, unspecified: Secondary | ICD-10-CM | POA: Diagnosis not present

## 2018-10-18 DIAGNOSIS — Z95828 Presence of other vascular implants and grafts: Secondary | ICD-10-CM | POA: Diagnosis not present

## 2018-10-18 DIAGNOSIS — R609 Edema, unspecified: Secondary | ICD-10-CM | POA: Diagnosis not present

## 2018-10-18 DIAGNOSIS — E785 Hyperlipidemia, unspecified: Secondary | ICD-10-CM | POA: Diagnosis not present

## 2018-10-18 DIAGNOSIS — Z79899 Other long term (current) drug therapy: Secondary | ICD-10-CM | POA: Diagnosis not present

## 2018-10-18 DIAGNOSIS — M5489 Other dorsalgia: Secondary | ICD-10-CM | POA: Diagnosis not present

## 2018-10-18 DIAGNOSIS — K219 Gastro-esophageal reflux disease without esophagitis: Secondary | ICD-10-CM | POA: Diagnosis not present

## 2018-10-18 DIAGNOSIS — G8929 Other chronic pain: Secondary | ICD-10-CM | POA: Diagnosis not present

## 2018-10-18 DIAGNOSIS — I951 Orthostatic hypotension: Secondary | ICD-10-CM | POA: Diagnosis not present

## 2018-10-18 DIAGNOSIS — R21 Rash and other nonspecific skin eruption: Secondary | ICD-10-CM | POA: Diagnosis not present

## 2018-10-18 DIAGNOSIS — C9200 Acute myeloblastic leukemia, not having achieved remission: Secondary | ICD-10-CM | POA: Diagnosis not present

## 2018-10-22 DIAGNOSIS — C9200 Acute myeloblastic leukemia, not having achieved remission: Secondary | ICD-10-CM | POA: Diagnosis not present

## 2018-10-22 DIAGNOSIS — Z5111 Encounter for antineoplastic chemotherapy: Secondary | ICD-10-CM | POA: Diagnosis not present

## 2018-10-22 DIAGNOSIS — R233 Spontaneous ecchymoses: Secondary | ICD-10-CM | POA: Diagnosis not present

## 2018-10-22 DIAGNOSIS — Z79899 Other long term (current) drug therapy: Secondary | ICD-10-CM | POA: Diagnosis not present

## 2018-10-23 DIAGNOSIS — C9200 Acute myeloblastic leukemia, not having achieved remission: Secondary | ICD-10-CM | POA: Diagnosis not present

## 2018-10-23 DIAGNOSIS — Z5111 Encounter for antineoplastic chemotherapy: Secondary | ICD-10-CM | POA: Diagnosis not present

## 2018-10-23 DIAGNOSIS — Z79899 Other long term (current) drug therapy: Secondary | ICD-10-CM | POA: Diagnosis not present

## 2018-10-24 DIAGNOSIS — C9200 Acute myeloblastic leukemia, not having achieved remission: Secondary | ICD-10-CM | POA: Diagnosis not present

## 2018-10-24 DIAGNOSIS — Z5111 Encounter for antineoplastic chemotherapy: Secondary | ICD-10-CM | POA: Diagnosis not present

## 2018-10-24 DIAGNOSIS — Z79899 Other long term (current) drug therapy: Secondary | ICD-10-CM | POA: Diagnosis not present

## 2018-10-25 DIAGNOSIS — Z5111 Encounter for antineoplastic chemotherapy: Secondary | ICD-10-CM | POA: Diagnosis not present

## 2018-10-25 DIAGNOSIS — Z79899 Other long term (current) drug therapy: Secondary | ICD-10-CM | POA: Diagnosis not present

## 2018-10-25 DIAGNOSIS — C9200 Acute myeloblastic leukemia, not having achieved remission: Secondary | ICD-10-CM | POA: Diagnosis not present

## 2018-11-01 DIAGNOSIS — C9200 Acute myeloblastic leukemia, not having achieved remission: Secondary | ICD-10-CM | POA: Diagnosis not present

## 2018-11-15 DIAGNOSIS — C9200 Acute myeloblastic leukemia, not having achieved remission: Secondary | ICD-10-CM | POA: Diagnosis not present

## 2018-11-29 DIAGNOSIS — G8929 Other chronic pain: Secondary | ICD-10-CM | POA: Diagnosis not present

## 2018-11-29 DIAGNOSIS — Z959 Presence of cardiac and vascular implant and graft, unspecified: Secondary | ICD-10-CM | POA: Diagnosis not present

## 2018-11-29 DIAGNOSIS — C9200 Acute myeloblastic leukemia, not having achieved remission: Secondary | ICD-10-CM | POA: Diagnosis not present

## 2018-11-29 DIAGNOSIS — M316 Other giant cell arteritis: Secondary | ICD-10-CM | POA: Diagnosis not present

## 2018-11-29 DIAGNOSIS — Z95828 Presence of other vascular implants and grafts: Secondary | ICD-10-CM | POA: Diagnosis not present

## 2018-11-29 DIAGNOSIS — K219 Gastro-esophageal reflux disease without esophagitis: Secondary | ICD-10-CM | POA: Diagnosis not present

## 2018-11-29 DIAGNOSIS — E785 Hyperlipidemia, unspecified: Secondary | ICD-10-CM | POA: Diagnosis not present

## 2018-11-29 DIAGNOSIS — R21 Rash and other nonspecific skin eruption: Secondary | ICD-10-CM | POA: Diagnosis not present

## 2018-11-29 DIAGNOSIS — M5489 Other dorsalgia: Secondary | ICD-10-CM | POA: Diagnosis not present

## 2018-12-03 DIAGNOSIS — C9200 Acute myeloblastic leukemia, not having achieved remission: Secondary | ICD-10-CM | POA: Diagnosis not present

## 2018-12-03 DIAGNOSIS — Z5111 Encounter for antineoplastic chemotherapy: Secondary | ICD-10-CM | POA: Diagnosis not present

## 2018-12-03 DIAGNOSIS — R600 Localized edema: Secondary | ICD-10-CM | POA: Diagnosis not present

## 2018-12-04 DIAGNOSIS — C9200 Acute myeloblastic leukemia, not having achieved remission: Secondary | ICD-10-CM | POA: Diagnosis not present

## 2018-12-04 DIAGNOSIS — Z5111 Encounter for antineoplastic chemotherapy: Secondary | ICD-10-CM | POA: Diagnosis not present

## 2018-12-05 DIAGNOSIS — Z5111 Encounter for antineoplastic chemotherapy: Secondary | ICD-10-CM | POA: Diagnosis not present

## 2018-12-05 DIAGNOSIS — C9200 Acute myeloblastic leukemia, not having achieved remission: Secondary | ICD-10-CM | POA: Diagnosis not present

## 2018-12-06 DIAGNOSIS — C9200 Acute myeloblastic leukemia, not having achieved remission: Secondary | ICD-10-CM | POA: Diagnosis not present

## 2018-12-06 DIAGNOSIS — R600 Localized edema: Secondary | ICD-10-CM | POA: Diagnosis not present

## 2018-12-06 DIAGNOSIS — Z5111 Encounter for antineoplastic chemotherapy: Secondary | ICD-10-CM | POA: Diagnosis not present

## 2018-12-13 DIAGNOSIS — C9200 Acute myeloblastic leukemia, not having achieved remission: Secondary | ICD-10-CM | POA: Diagnosis not present

## 2018-12-19 ENCOUNTER — Ambulatory Visit (INDEPENDENT_AMBULATORY_CARE_PROVIDER_SITE_OTHER): Payer: Medicare Other | Admitting: *Deleted

## 2018-12-19 DIAGNOSIS — I442 Atrioventricular block, complete: Secondary | ICD-10-CM

## 2018-12-20 LAB — CUP PACEART REMOTE DEVICE CHECK
Battery Remaining Longevity: 108 mo
Battery Remaining Percentage: 95.5 %
Battery Voltage: 2.99 V
Brady Statistic AP VP Percent: 26 %
Brady Statistic AP VS Percent: 1 %
Brady Statistic AS VP Percent: 74 %
Brady Statistic AS VS Percent: 1 %
Brady Statistic RA Percent Paced: 26 %
Brady Statistic RV Percent Paced: 99 %
Date Time Interrogation Session: 20200917153740
Implantable Lead Implant Date: 20090813
Implantable Lead Implant Date: 20090813
Implantable Lead Location: 753859
Implantable Lead Location: 753860
Implantable Pulse Generator Implant Date: 20180925
Lead Channel Impedance Value: 450 Ohm
Lead Channel Impedance Value: 560 Ohm
Lead Channel Pacing Threshold Amplitude: 0.5 V
Lead Channel Pacing Threshold Amplitude: 1 V
Lead Channel Pacing Threshold Pulse Width: 0.5 ms
Lead Channel Pacing Threshold Pulse Width: 0.5 ms
Lead Channel Sensing Intrinsic Amplitude: 12 mV
Lead Channel Sensing Intrinsic Amplitude: 3 mV
Lead Channel Setting Pacing Amplitude: 2 V
Lead Channel Setting Pacing Amplitude: 2.5 V
Lead Channel Setting Pacing Pulse Width: 0.5 ms
Lead Channel Setting Sensing Sensitivity: 4 mV
Pulse Gen Model: 2272
Pulse Gen Serial Number: 8943792

## 2018-12-25 ENCOUNTER — Encounter: Payer: Self-pay | Admitting: Cardiology

## 2018-12-25 NOTE — Progress Notes (Signed)
Remote pacemaker transmission.   

## 2018-12-27 DIAGNOSIS — C9200 Acute myeloblastic leukemia, not having achieved remission: Secondary | ICD-10-CM | POA: Diagnosis not present

## 2019-01-09 ENCOUNTER — Telehealth: Payer: Self-pay | Admitting: *Deleted

## 2019-01-09 NOTE — Telephone Encounter (Signed)
A message ws left, re: follow up visit.

## 2019-01-10 DIAGNOSIS — Z961 Presence of intraocular lens: Secondary | ICD-10-CM | POA: Diagnosis not present

## 2019-01-10 DIAGNOSIS — E785 Hyperlipidemia, unspecified: Secondary | ICD-10-CM | POA: Diagnosis not present

## 2019-01-10 DIAGNOSIS — H353131 Nonexudative age-related macular degeneration, bilateral, early dry stage: Secondary | ICD-10-CM | POA: Diagnosis not present

## 2019-01-10 DIAGNOSIS — H52203 Unspecified astigmatism, bilateral: Secondary | ICD-10-CM | POA: Diagnosis not present

## 2019-01-10 DIAGNOSIS — G8929 Other chronic pain: Secondary | ICD-10-CM | POA: Diagnosis not present

## 2019-01-10 DIAGNOSIS — K219 Gastro-esophageal reflux disease without esophagitis: Secondary | ICD-10-CM | POA: Diagnosis not present

## 2019-01-10 DIAGNOSIS — R21 Rash and other nonspecific skin eruption: Secondary | ICD-10-CM | POA: Diagnosis not present

## 2019-01-10 DIAGNOSIS — Z79899 Other long term (current) drug therapy: Secondary | ICD-10-CM | POA: Diagnosis not present

## 2019-01-10 DIAGNOSIS — C9200 Acute myeloblastic leukemia, not having achieved remission: Secondary | ICD-10-CM | POA: Diagnosis not present

## 2019-01-10 DIAGNOSIS — Z959 Presence of cardiac and vascular implant and graft, unspecified: Secondary | ICD-10-CM | POA: Diagnosis not present

## 2019-01-10 DIAGNOSIS — Z5111 Encounter for antineoplastic chemotherapy: Secondary | ICD-10-CM | POA: Diagnosis not present

## 2019-01-14 DIAGNOSIS — Z5111 Encounter for antineoplastic chemotherapy: Secondary | ICD-10-CM | POA: Diagnosis not present

## 2019-01-14 DIAGNOSIS — C9200 Acute myeloblastic leukemia, not having achieved remission: Secondary | ICD-10-CM | POA: Diagnosis not present

## 2019-01-15 ENCOUNTER — Telehealth: Payer: Self-pay | Admitting: *Deleted

## 2019-01-15 DIAGNOSIS — Z5111 Encounter for antineoplastic chemotherapy: Secondary | ICD-10-CM | POA: Diagnosis not present

## 2019-01-15 DIAGNOSIS — C9200 Acute myeloblastic leukemia, not having achieved remission: Secondary | ICD-10-CM | POA: Diagnosis not present

## 2019-01-15 NOTE — Telephone Encounter (Signed)
A message was left, re: her follow up visit. 

## 2019-01-16 DIAGNOSIS — Z5111 Encounter for antineoplastic chemotherapy: Secondary | ICD-10-CM | POA: Diagnosis not present

## 2019-01-16 DIAGNOSIS — C9200 Acute myeloblastic leukemia, not having achieved remission: Secondary | ICD-10-CM | POA: Diagnosis not present

## 2019-01-17 DIAGNOSIS — C9200 Acute myeloblastic leukemia, not having achieved remission: Secondary | ICD-10-CM | POA: Diagnosis not present

## 2019-01-17 DIAGNOSIS — Z5111 Encounter for antineoplastic chemotherapy: Secondary | ICD-10-CM | POA: Diagnosis not present

## 2019-01-17 DIAGNOSIS — Z79899 Other long term (current) drug therapy: Secondary | ICD-10-CM | POA: Diagnosis not present

## 2019-01-17 DIAGNOSIS — Z5181 Encounter for therapeutic drug level monitoring: Secondary | ICD-10-CM | POA: Diagnosis not present

## 2019-01-24 DIAGNOSIS — C9200 Acute myeloblastic leukemia, not having achieved remission: Secondary | ICD-10-CM | POA: Diagnosis not present

## 2019-01-24 NOTE — Progress Notes (Signed)
HPI  Patient presents for followup of coronary disease.   She continues to be treated for AML.  She is on decitabine and Venclexta.  The patient denies any new symptoms such as chest discomfort, neck or arm discomfort. There has been no new shortness of breath, PND or orthopnea. There have been no reported palpitations, presyncope or syncope.  She did have an echo last year and I reviewed the results in [place].  The EF was stable at 45 to 50%.  She has had no new shortness of breath, PND or orthopnea.  She is not having any chest pressure, neck or arm discomfort.  She gets 3 weeks of her therapy and 3 weeks off.  Her platelets are borderline at 56,000.  White blood cell count 2.8.  She is mildly anemic.   Allergies  Allergen Reactions  . Hydrocodone Shortness Of Breath and Other (See Comments)    SOB, passed out feeling, nausea   . Codeine Nausea And Vomiting and Other (See Comments)    Also passed out  . Cymbalta [Duloxetine Hcl] Nausea And Vomiting and Other (See Comments)    Also passed out  . Lyrica [Pregabalin] Nausea And Vomiting    Current Outpatient Medications  Medication Sig Dispense Refill  . atorvastatin (LIPITOR) 20 MG tablet Take 1 tablet (20 mg total) by mouth daily at 6 PM.    . B Complex-C (B-COMPLEX WITH VITAMIN C) tablet Take 1 tablet by mouth daily.    . DECITABINE IV Inject into the vein. 4 days within 6 weeks    . fluconazole (DIFLUCAN) 200 MG tablet Take 1 tablet by mouth daily. Take while neuropenic until advised to stop by your oncologist    . ondansetron (ZOFRAN) 8 MG tablet Take 8 mg by mouth every 8 (eight) hours as needed for nausea or vomiting.    . pantoprazole (PROTONIX) 40 MG tablet Take 1 tablet by mouth daily as needed for heartburn.    . polyethylene glycol (MIRALAX / GLYCOLAX) packet Take 17 g by mouth daily as needed for moderate constipation or severe constipation. 14 each 0  . traMADol (ULTRAM) 50 MG tablet Take 1-2 tablets by mouth  daily as needed for pain.    . VENCLEXTA 100 MG TABS Take 2 tablets by mouth daily. Takes everyday for 3 weeks, then off for 3 weeks    . Vitamin D, Ergocalciferol, (DRISDOL) 50000 units CAPS capsule Take 50,000 Units by mouth every 7 (seven) days.   1   No current facility-administered medications for this visit.     Past Medical History:  Diagnosis Date  . Arthritis    R shoulder, both knees & low back- degenerative   . Asthma    as a child  . Complete heart block (HCC)    s/p STJ dual chamber PPM   . Complication of anesthesia 2011   told that it as hard to pass tube when they intubated for pacemaker insertion   . Coronary artery disease    s/p PCI  . Family history of adverse reaction to anesthesia    father hard to put to sleep but he also used a lot alcohol    . Giant cell arteritis (HCC)    polymyalgia rheumatica  . H/O: hysterectomy   . History of hiatal hernia   . Hyperlipidemia   . Hypertension    not treated with med.   . Irritable bowel syndrome   . Leukemia, acute myeloid ([place])   .  Polymyalgia rheumatica ([place])   . S/P breast biopsy    secondary to nipple discharge    Past Surgical History:  Procedure Laterality Date  . ABDOMINAL HYSTERECTOMY    . APPENDECTOMY    . BREAST BIOPSY     secondary to nipple discharge  . CARDIAC CATHETERIZATION  2001   x1stent - Alfred Little   . CESAREAN SECTION    . CHOLECYSTECTOMY    . EYE SURGERY     cataracts removed & IOL- both eyes   . INSERT / REPLACE / REMOVE PACEMAKER    . KNEE ARTHROSCOPY Left   . LUMBAR LAMINECTOMY/DECOMPRESSION MICRODISCECTOMY Left 06/08/2015   Procedure: Left Lumbar Two-Three Microdiskectomy;  Surgeon: Kary Kos, MD;  Location: [place] [facility];  Service: Neurosurgery;  Laterality: Left;  Left L2-3 Microdiskectomy  . PACEMAKER INSERTION  11/15/2007   St. Jude Pacemaker Zephyr XL implanted in [place]  . pilondial cyst resection    . PPM GENERATOR CHANGEOUT N/A 12/27/2016   SJM Assurity MRI generator change  by Dr Rayann Heman  . TEMPORAL ARTERY BIOPSY / LIGATION    . TONSILLECTOMY      ROS:   As stated in the HPI and negative for all other systems.   PHYSICAL EXAM BP (!) 158/60   Pulse 81   Ht 5' (1.524 m)   Wt 124 lb (56.2 kg)   BMI 24.22 kg/m   GENERAL:  Well appearing NECK:  No jugular venous distention, waveform within normal limits, carotid upstroke brisk and symmetric, no bruits, no thyromegaly LUNGS:  Clear to auscultation bilaterally CHEST: Well-healed pacemaker pocket HEART:  PMI not displaced or sustained,S1 and S2 within normal limits, no S3, no S4, no clicks, no rubs, no murmurs ABD:  Flat, positive bowel sounds normal in frequency in pitch, no bruits, no rebound, no guarding, no midline pulsatile mass, no hepatomegaly, no splenomegaly EXT:  2 plus pulses throughout, no edema, no cyanosis no clubbing   EKG: Sinus rhythm with ventricular pacing 100% capture  ASSESSMENT AND PLAN  CAD -  She had a low risk stress perfusion study in Dec 2012.  No change in therapy.  HYPERTENSION -  The blood pressure is mildly elevated but it has been low recently in the past.  Therefore, no change in therapy.   PACEMAKER, PERMANENT -  She had normal device function in Sept.  I reviewed this report for this visit.   CARDIOMYOPATHY - She has a mildly reduced ejection fraction but no symptoms related to this.  No change in therapy.  No further imaging.

## 2019-01-25 ENCOUNTER — Ambulatory Visit (INDEPENDENT_AMBULATORY_CARE_PROVIDER_SITE_OTHER): Payer: Medicare Other | Admitting: Cardiology

## 2019-01-25 ENCOUNTER — Other Ambulatory Visit: Payer: Self-pay

## 2019-01-25 ENCOUNTER — Encounter: Payer: Self-pay | Admitting: Cardiology

## 2019-01-25 VITALS — BP 158/60 | HR 81 | Ht 60.0 in | Wt 124.0 lb

## 2019-01-25 DIAGNOSIS — I10 Essential (primary) hypertension: Secondary | ICD-10-CM | POA: Diagnosis not present

## 2019-01-25 DIAGNOSIS — I2510 Atherosclerotic heart disease of native coronary artery without angina pectoris: Secondary | ICD-10-CM | POA: Diagnosis not present

## 2019-01-25 NOTE — Patient Instructions (Signed)
Medication Instructions:  Your physician recommends that you continue on your current medications as directed. Please refer to the Current Medication list given to you today.  If you need a refill on your cardiac medications before your next appointment, please call your pharmacy.   Lab work: NONE  Testing/Procedures: NONE  Follow-Up: At [facility], you and your health needs are our priority.  As part of our continuing mission to provide you with exceptional heart care, we have created designated Provider Care Teams.  These Care Teams include your primary Cardiologist (physician) and Advanced Practice Providers (APPs -  Physician Assistants and Nurse Practitioners) who all work together to provide you with the care you need, when you need it. You may see Dr Percival Spanish or one of the following Advanced Practice Providers on your designated Care Team:    Rosaria Ferries, PA-C  Jory Sims, DNP, ANP  Cadence Kathlen Mody, NP Your physician wants you to follow-up in: 1 year. You will receive a reminder letter in the mail two months in advance. If you don't receive a letter, please call our office to schedule the follow-up appointment.

## 2019-02-07 DIAGNOSIS — C9200 Acute myeloblastic leukemia, not having achieved remission: Secondary | ICD-10-CM | POA: Diagnosis not present

## 2019-02-21 DIAGNOSIS — G8929 Other chronic pain: Secondary | ICD-10-CM | POA: Diagnosis not present

## 2019-02-21 DIAGNOSIS — K219 Gastro-esophageal reflux disease without esophagitis: Secondary | ICD-10-CM | POA: Diagnosis not present

## 2019-02-21 DIAGNOSIS — R21 Rash and other nonspecific skin eruption: Secondary | ICD-10-CM | POA: Diagnosis not present

## 2019-02-21 DIAGNOSIS — I110 Hypertensive heart disease with heart failure: Secondary | ICD-10-CM | POA: Diagnosis not present

## 2019-02-21 DIAGNOSIS — E785 Hyperlipidemia, unspecified: Secondary | ICD-10-CM | POA: Diagnosis not present

## 2019-02-21 DIAGNOSIS — C9200 Acute myeloblastic leukemia, not having achieved remission: Secondary | ICD-10-CM | POA: Diagnosis not present

## 2019-02-21 DIAGNOSIS — Z959 Presence of cardiac and vascular implant and graft, unspecified: Secondary | ICD-10-CM | POA: Diagnosis not present

## 2019-02-21 DIAGNOSIS — I509 Heart failure, unspecified: Secondary | ICD-10-CM | POA: Diagnosis not present

## 2019-02-21 DIAGNOSIS — I2510 Atherosclerotic heart disease of native coronary artery without angina pectoris: Secondary | ICD-10-CM | POA: Diagnosis not present

## 2019-02-21 DIAGNOSIS — Z79899 Other long term (current) drug therapy: Secondary | ICD-10-CM | POA: Diagnosis not present

## 2019-02-25 DIAGNOSIS — I10 Essential (primary) hypertension: Secondary | ICD-10-CM | POA: Diagnosis not present

## 2019-02-25 DIAGNOSIS — C9200 Acute myeloblastic leukemia, not having achieved remission: Secondary | ICD-10-CM | POA: Diagnosis not present

## 2019-02-25 DIAGNOSIS — I2510 Atherosclerotic heart disease of native coronary artery without angina pectoris: Secondary | ICD-10-CM | POA: Diagnosis not present

## 2019-02-25 DIAGNOSIS — Z5111 Encounter for antineoplastic chemotherapy: Secondary | ICD-10-CM | POA: Diagnosis not present

## 2019-02-25 DIAGNOSIS — Z79899 Other long term (current) drug therapy: Secondary | ICD-10-CM | POA: Diagnosis not present

## 2019-02-25 DIAGNOSIS — E785 Hyperlipidemia, unspecified: Secondary | ICD-10-CM | POA: Diagnosis not present

## 2019-02-25 DIAGNOSIS — Z5181 Encounter for therapeutic drug level monitoring: Secondary | ICD-10-CM | POA: Diagnosis not present

## 2019-02-26 DIAGNOSIS — C9200 Acute myeloblastic leukemia, not having achieved remission: Secondary | ICD-10-CM | POA: Diagnosis not present

## 2019-02-26 DIAGNOSIS — Z79899 Other long term (current) drug therapy: Secondary | ICD-10-CM | POA: Diagnosis not present

## 2019-02-26 DIAGNOSIS — Z5111 Encounter for antineoplastic chemotherapy: Secondary | ICD-10-CM | POA: Diagnosis not present

## 2019-02-27 DIAGNOSIS — Z5111 Encounter for antineoplastic chemotherapy: Secondary | ICD-10-CM | POA: Diagnosis not present

## 2019-02-27 DIAGNOSIS — C9200 Acute myeloblastic leukemia, not having achieved remission: Secondary | ICD-10-CM | POA: Diagnosis not present

## 2019-02-27 DIAGNOSIS — Z79899 Other long term (current) drug therapy: Secondary | ICD-10-CM | POA: Diagnosis not present

## 2019-03-01 DIAGNOSIS — Z79899 Other long term (current) drug therapy: Secondary | ICD-10-CM | POA: Diagnosis not present

## 2019-03-01 DIAGNOSIS — C9200 Acute myeloblastic leukemia, not having achieved remission: Secondary | ICD-10-CM | POA: Diagnosis not present

## 2019-03-01 DIAGNOSIS — Z5181 Encounter for therapeutic drug level monitoring: Secondary | ICD-10-CM | POA: Diagnosis not present

## 2019-03-01 DIAGNOSIS — Z5111 Encounter for antineoplastic chemotherapy: Secondary | ICD-10-CM | POA: Diagnosis not present

## 2019-03-20 ENCOUNTER — Ambulatory Visit (INDEPENDENT_AMBULATORY_CARE_PROVIDER_SITE_OTHER): Payer: Medicare Other | Admitting: *Deleted

## 2019-03-20 DIAGNOSIS — I442 Atrioventricular block, complete: Secondary | ICD-10-CM

## 2019-03-21 LAB — CUP PACEART REMOTE DEVICE CHECK
Battery Remaining Longevity: 108 mo
Battery Remaining Percentage: 95.5 %
Battery Voltage: 2.99 V
Brady Statistic AP VP Percent: 27 %
Brady Statistic AP VS Percent: 1 %
Brady Statistic AS VP Percent: 72 %
Brady Statistic AS VS Percent: 1 %
Brady Statistic RA Percent Paced: 27 %
Brady Statistic RV Percent Paced: 99 %
Date Time Interrogation Session: 20201216043112
Implantable Lead Implant Date: 20090813
Implantable Lead Implant Date: 20090813
Implantable Lead Location: 753859
Implantable Lead Location: 753860
Implantable Pulse Generator Implant Date: 20180925
Lead Channel Impedance Value: 430 Ohm
Lead Channel Impedance Value: 590 Ohm
Lead Channel Pacing Threshold Amplitude: 0.5 V
Lead Channel Pacing Threshold Amplitude: 1 V
Lead Channel Pacing Threshold Pulse Width: 0.5 ms
Lead Channel Pacing Threshold Pulse Width: 0.5 ms
Lead Channel Sensing Intrinsic Amplitude: 12 mV
Lead Channel Sensing Intrinsic Amplitude: 2.9 mV
Lead Channel Setting Pacing Amplitude: 2 V
Lead Channel Setting Pacing Amplitude: 2.5 V
Lead Channel Setting Pacing Pulse Width: 0.5 ms
Lead Channel Setting Sensing Sensitivity: 4 mV
Pulse Gen Model: 2272
Pulse Gen Serial Number: 8943792

## 2019-04-08 DIAGNOSIS — C9200 Acute myeloblastic leukemia, not having achieved remission: Secondary | ICD-10-CM | POA: Diagnosis not present

## 2019-04-08 DIAGNOSIS — Z79899 Other long term (current) drug therapy: Secondary | ICD-10-CM | POA: Diagnosis not present

## 2019-04-08 DIAGNOSIS — Z5181 Encounter for therapeutic drug level monitoring: Secondary | ICD-10-CM | POA: Diagnosis not present

## 2019-04-08 DIAGNOSIS — I2510 Atherosclerotic heart disease of native coronary artery without angina pectoris: Secondary | ICD-10-CM | POA: Diagnosis not present

## 2019-04-09 DIAGNOSIS — C9200 Acute myeloblastic leukemia, not having achieved remission: Secondary | ICD-10-CM | POA: Diagnosis not present

## 2019-04-10 DIAGNOSIS — C9200 Acute myeloblastic leukemia, not having achieved remission: Secondary | ICD-10-CM | POA: Diagnosis not present

## 2019-04-10 DIAGNOSIS — Z5111 Encounter for antineoplastic chemotherapy: Secondary | ICD-10-CM | POA: Diagnosis not present

## 2019-04-11 DIAGNOSIS — I10 Essential (primary) hypertension: Secondary | ICD-10-CM | POA: Diagnosis not present

## 2019-04-11 DIAGNOSIS — I776 Arteritis, unspecified: Secondary | ICD-10-CM | POA: Diagnosis not present

## 2019-04-11 DIAGNOSIS — R42 Dizziness and giddiness: Secondary | ICD-10-CM | POA: Diagnosis not present

## 2019-04-11 DIAGNOSIS — R609 Edema, unspecified: Secondary | ICD-10-CM | POA: Diagnosis not present

## 2019-04-11 DIAGNOSIS — R0600 Dyspnea, unspecified: Secondary | ICD-10-CM | POA: Diagnosis not present

## 2019-04-11 DIAGNOSIS — C9200 Acute myeloblastic leukemia, not having achieved remission: Secondary | ICD-10-CM | POA: Diagnosis not present

## 2019-04-11 DIAGNOSIS — Z5181 Encounter for therapeutic drug level monitoring: Secondary | ICD-10-CM | POA: Diagnosis not present

## 2019-04-11 DIAGNOSIS — K219 Gastro-esophageal reflux disease without esophagitis: Secondary | ICD-10-CM | POA: Diagnosis not present

## 2019-04-11 DIAGNOSIS — Z955 Presence of coronary angioplasty implant and graft: Secondary | ICD-10-CM | POA: Diagnosis not present

## 2019-04-11 DIAGNOSIS — Z888 Allergy status to other drugs, medicaments and biological substances status: Secondary | ICD-10-CM | POA: Diagnosis not present

## 2019-04-11 DIAGNOSIS — K5900 Constipation, unspecified: Secondary | ICD-10-CM | POA: Diagnosis not present

## 2019-04-11 DIAGNOSIS — I951 Orthostatic hypotension: Secondary | ICD-10-CM | POA: Diagnosis not present

## 2019-04-11 DIAGNOSIS — R600 Localized edema: Secondary | ICD-10-CM | POA: Diagnosis not present

## 2019-04-11 DIAGNOSIS — K589 Irritable bowel syndrome without diarrhea: Secondary | ICD-10-CM | POA: Diagnosis not present

## 2019-04-11 DIAGNOSIS — R21 Rash and other nonspecific skin eruption: Secondary | ICD-10-CM | POA: Diagnosis not present

## 2019-04-11 DIAGNOSIS — I2510 Atherosclerotic heart disease of native coronary artery without angina pectoris: Secondary | ICD-10-CM | POA: Diagnosis not present

## 2019-04-11 DIAGNOSIS — K449 Diaphragmatic hernia without obstruction or gangrene: Secondary | ICD-10-CM | POA: Diagnosis not present

## 2019-04-11 DIAGNOSIS — E785 Hyperlipidemia, unspecified: Secondary | ICD-10-CM | POA: Diagnosis not present

## 2019-04-11 DIAGNOSIS — Z95828 Presence of other vascular implants and grafts: Secondary | ICD-10-CM | POA: Diagnosis not present

## 2019-04-11 DIAGNOSIS — Z9889 Other specified postprocedural states: Secondary | ICD-10-CM | POA: Diagnosis not present

## 2019-04-11 DIAGNOSIS — M549 Dorsalgia, unspecified: Secondary | ICD-10-CM | POA: Diagnosis not present

## 2019-04-11 DIAGNOSIS — Z79899 Other long term (current) drug therapy: Secondary | ICD-10-CM | POA: Diagnosis not present

## 2019-04-11 DIAGNOSIS — G8929 Other chronic pain: Secondary | ICD-10-CM | POA: Diagnosis not present

## 2019-04-29 ENCOUNTER — Ambulatory Visit: Payer: Medicare Other | Attending: Internal Medicine

## 2019-04-29 DIAGNOSIS — Z23 Encounter for immunization: Secondary | ICD-10-CM

## 2019-04-29 NOTE — Progress Notes (Signed)
   Covid-19 Vaccination Clinic  Name:  Jamie Reese    MRN: [id number] DOB: November 12, 1933  04/29/2019  Jamie Reese was observed post Covid-19 immunization for 30 minutes based on pre-vaccination screening without incidence. She was provided with Vaccine Information Sheet and instruction to access the V-Safe system.   Jamie Reese was instructed to call 911 with any severe reactions post vaccine: Marland Kitchen Difficulty breathing  . Swelling of your face and throat  . A fast heartbeat  . A bad rash all over your body  . Dizziness and weakness    Immunizations Administered    Name Date Dose VIS Date Route   Pfizer COVID-19 Vaccine 04/29/2019 11:19 AM 0.3 mL 03/15/2019 Intramuscular   Manufacturer: [place]   Lot: [id number]   [place]: [id number]

## 2019-04-30 ENCOUNTER — Telehealth: Payer: Self-pay

## 2019-05-02 ENCOUNTER — Encounter: Payer: Self-pay | Admitting: Internal Medicine

## 2019-05-02 ENCOUNTER — Telehealth (INDEPENDENT_AMBULATORY_CARE_PROVIDER_SITE_OTHER): Payer: Medicare Other | Admitting: Internal Medicine

## 2019-05-02 VITALS — BP 141/81 | Ht 60.0 in | Wt 122.0 lb

## 2019-05-02 DIAGNOSIS — I2510 Atherosclerotic heart disease of native coronary artery without angina pectoris: Secondary | ICD-10-CM | POA: Diagnosis not present

## 2019-05-02 DIAGNOSIS — I442 Atrioventricular block, complete: Secondary | ICD-10-CM

## 2019-05-02 DIAGNOSIS — I10 Essential (primary) hypertension: Secondary | ICD-10-CM

## 2019-05-02 NOTE — Progress Notes (Signed)
Electrophysiology TeleHealth Note   Due to national recommendations of social distancing due to COVID 19, an audio/video telehealth visit is felt to be most appropriate for this patient at this time.  See MyChart message from today for the patient's consent to telehealth for [facility].   Date:  05/02/2019   ID:  Jamie Reese, DOB 01/05/1934, MRN [id number]  Location: patient's home  Provider location:  [facility]  Evaluation Performed: Follow-up visit  PCP:  Prince Solian, MD   Electrophysiologist:  Dr Rayann Heman  Chief Complaint:  palpitations  History of Present Illness:    Jamie Reese is a 86 y.o. female who presents via telehealth conferencing today.  Since last being seen in our clinic, the patient reports doing very well.  Today, she denies symptoms of palpitations, chest pain, shortness of breath,  lower extremity edema, dizziness, presyncope, or syncope.  The patient is otherwise without complaint today.  The patient denies symptoms of fevers, chills, cough, or new SOB worrisome for COVID 19.  Past Medical History:  Diagnosis Date  . Arthritis    R shoulder, both knees & low back- degenerative   . Asthma    as a child  . Complete heart block (HCC)    s/p STJ dual chamber PPM   . Complication of anesthesia 2011   told that it as hard to pass tube when they intubated for pacemaker insertion   . Coronary artery disease    s/p PCI  . Family history of adverse reaction to anesthesia    father hard to put to sleep but he also used a lot alcohol    . Giant cell arteritis (HCC)    polymyalgia rheumatica  . H/O: hysterectomy   . History of hiatal hernia   . Hyperlipidemia   . Hypertension    not treated with med.   . Irritable bowel syndrome   . Leukemia, acute myeloid ([place])   . Polymyalgia rheumatica ([place])   . S/P breast biopsy    secondary to nipple discharge    Past Surgical History:  Procedure Laterality Date  . ABDOMINAL HYSTERECTOMY    .  APPENDECTOMY    . BREAST BIOPSY     secondary to nipple discharge  . CARDIAC CATHETERIZATION  2001   x1stent - Alfred Little   . CESAREAN SECTION    . CHOLECYSTECTOMY    . EYE SURGERY     cataracts removed & IOL- both eyes   . INSERT / REPLACE / REMOVE PACEMAKER    . KNEE ARTHROSCOPY Left   . LUMBAR LAMINECTOMY/DECOMPRESSION MICRODISCECTOMY Left 06/08/2015   Procedure: Left Lumbar Two-Three Microdiskectomy;  Surgeon: Kary Kos, MD;  Location: [place] [facility];  Service: Neurosurgery;  Laterality: Left;  Left L2-3 Microdiskectomy  . PACEMAKER INSERTION  11/15/2007   St. Jude Pacemaker Zephyr XL implanted in [place]  . pilondial cyst resection    . PPM GENERATOR CHANGEOUT N/A 12/27/2016   SJM Assurity MRI generator change by Dr Rayann Heman  . TEMPORAL ARTERY BIOPSY / LIGATION    . TONSILLECTOMY      Current Outpatient Medications  Medication Sig Dispense Refill  . atorvastatin (LIPITOR) 20 MG tablet Take 1 tablet (20 mg total) by mouth daily at 6 PM.    . B Complex-C (B-COMPLEX WITH VITAMIN C) tablet Take 1 tablet by mouth daily.    . DECITABINE IV Inject into the vein. 4 days within 6 weeks    . fluconazole (DIFLUCAN) 200 MG  tablet Take 1 tablet by mouth daily. Take while neuropenic until advised to stop by your oncologist    . ondansetron (ZOFRAN) 8 MG tablet Take 8 mg by mouth every 8 (eight) hours as needed for nausea or vomiting.    . pantoprazole (PROTONIX) 40 MG tablet Take 1 tablet by mouth daily as needed for heartburn.    . polyethylene glycol (MIRALAX / GLYCOLAX) packet Take 17 g by mouth daily as needed for moderate constipation or severe constipation. 14 each 0  . traMADol (ULTRAM) 50 MG tablet Take 1-2 tablets by mouth daily as needed for pain.    . VENCLEXTA 100 MG TABS Take 2 tablets by mouth daily. Takes everyday for 3 weeks, then off for 3 weeks    . Vitamin D, Ergocalciferol, (DRISDOL) 50000 units CAPS capsule Take 50,000 Units by mouth every 7 (seven) days.   1   No current  facility-administered medications for this visit.    Allergies:   Hydrocodone, Codeine, Cymbalta [duloxetine hcl], and Lyrica [pregabalin]   Social History:  The patient  reports that she has never smoked. She has never used smokeless tobacco. She reports that she does not drink alcohol or use drugs.   Family History:  The patient's family history includes Cancer in an other family member; Heart Problems in her mother; Heart attack (age of onset: 96) in her father.   ROS:  Please see the history of present illness.   All other systems are personally reviewed and negative.    Exam:    Vital Signs:  BP (!) 141/81   Ht 5' (1.524 m)   Wt 122 lb (55.3 kg)   BMI 23.83 kg/m   Well sounding and appearing, alert and conversant, regular work of breathing,  good skin color Eyes- anicteric, neuro- grossly intact, skin- no apparent rash or lesions or cyanosis, mouth- oral mucosa is pink  Labs/Other Tests and Data Reviewed:    Recent Labs: No results found for requested labs within last 8760 hours.   Wt Readings from Last 3 Encounters:  05/02/19 122 lb (55.3 kg)  01/25/19 124 lb (56.2 kg)  05/02/18 119 lb 9.6 oz (54.3 kg)     Last device remote is reviewed from [place] PDF which reveals normal device function, no arrhythmias    ASSESSMENT & PLAN:    1.  Complete heart block Remotes are uptodate Normal pacemaker function  2. HTN Stable No change required today  3. CAD No ischemic symptoms   Follow-up:  Return to see EP NP in a year   Patient Risk:  after full review of this patients clinical status, I feel that they are at moderate risk at this time.  Today, I have spent 15 minutes with the patient with telehealth technology discussing arrhythmia management .    Army Fossa, MD  05/02/2019 3:15 PM     [place] [address] [phone] (office) [phone] (fax)

## 2019-05-20 ENCOUNTER — Ambulatory Visit: Payer: Medicare Other | Attending: Internal Medicine

## 2019-05-20 DIAGNOSIS — Z23 Encounter for immunization: Secondary | ICD-10-CM | POA: Insufficient documentation

## 2019-05-20 NOTE — Progress Notes (Signed)
   Covid-19 Vaccination Clinic  Name:  Jamie Reese    MRN: [id number] DOB: February 22, 1934  05/20/2019  [name] was observed post Covid-19 immunization for 30 minutes based on pre-vaccination screening without incidence. She was provided with Vaccine Information Sheet and instruction to access the V-Safe system.   [name] was instructed to call 911 with any severe reactions post vaccine: Marland Kitchen Difficulty breathing  . Swelling of your face and throat  . A fast heartbeat  . A bad rash all over your body  . Dizziness and weakness    Immunizations Administered    Name Date Dose VIS Date Route   Pfizer COVID-19 Vaccine 05/20/2019  2:12 PM 0.3 mL 03/15/2019 Intramuscular   Manufacturer: [place]   Lot: [id number]   [place]: [id number]

## 2019-05-21 DIAGNOSIS — Z79899 Other long term (current) drug therapy: Secondary | ICD-10-CM | POA: Diagnosis not present

## 2019-05-21 DIAGNOSIS — Z5181 Encounter for therapeutic drug level monitoring: Secondary | ICD-10-CM | POA: Diagnosis not present

## 2019-05-21 DIAGNOSIS — C9200 Acute myeloblastic leukemia, not having achieved remission: Secondary | ICD-10-CM | POA: Diagnosis not present

## 2019-05-21 DIAGNOSIS — Z5111 Encounter for antineoplastic chemotherapy: Secondary | ICD-10-CM | POA: Diagnosis not present

## 2019-05-22 DIAGNOSIS — C9200 Acute myeloblastic leukemia, not having achieved remission: Secondary | ICD-10-CM | POA: Diagnosis not present

## 2019-05-24 DIAGNOSIS — Z79899 Other long term (current) drug therapy: Secondary | ICD-10-CM | POA: Diagnosis not present

## 2019-05-24 DIAGNOSIS — M7981 Nontraumatic hematoma of soft tissue: Secondary | ICD-10-CM | POA: Diagnosis not present

## 2019-05-24 DIAGNOSIS — R252 Cramp and spasm: Secondary | ICD-10-CM | POA: Diagnosis not present

## 2019-05-24 DIAGNOSIS — C9200 Acute myeloblastic leukemia, not having achieved remission: Secondary | ICD-10-CM | POA: Diagnosis not present

## 2019-05-24 DIAGNOSIS — Z5181 Encounter for therapeutic drug level monitoring: Secondary | ICD-10-CM | POA: Diagnosis not present

## 2019-05-25 DIAGNOSIS — C9200 Acute myeloblastic leukemia, not having achieved remission: Secondary | ICD-10-CM | POA: Diagnosis not present

## 2019-06-12 ENCOUNTER — Other Ambulatory Visit: Payer: Self-pay | Admitting: Cardiology

## 2019-06-12 NOTE — Telephone Encounter (Signed)
  *  STAT* If patient is at the pharmacy, call can be transferred to refill team.   1. Which medications need to be refilled? (please list name of each medication and dose if known) atorvastatin (LIPITOR) 20 MG tablet  2. Which pharmacy/location (including street and city if local pharmacy) is medication to be sent to? [place], [place]  3. Do they need a 30 day or 90 day supply? 90 days  Pt said Dr. Warren Lacy knew she's taking this medication and would like to get the refill from him. She's about to run out of this medication.

## 2019-06-17 MED ORDER — ATORVASTATIN CALCIUM 20 MG PO TABS: 20.0000 mg | ORAL_TABLET | Freq: Every day | ORAL | 2 refills | Status: DC

## 2019-06-19 ENCOUNTER — Ambulatory Visit (INDEPENDENT_AMBULATORY_CARE_PROVIDER_SITE_OTHER): Payer: Medicare Other | Admitting: *Deleted

## 2019-06-19 DIAGNOSIS — I442 Atrioventricular block, complete: Secondary | ICD-10-CM | POA: Diagnosis not present

## 2019-06-20 ENCOUNTER — Telehealth: Payer: Self-pay | Admitting: Cardiology

## 2019-06-20 ENCOUNTER — Other Ambulatory Visit: Payer: Self-pay | Admitting: Cardiology

## 2019-06-20 NOTE — Telephone Encounter (Signed)
New Message   *STAT* If patient is at the pharmacy, call can be transferred to refill team.   1. Which medications need to be refilled? (please list name of each medication and dose if known) atorvastatin (LIPITOR) 20 MG tablet  2. Which pharmacy/location (including street and city if local pharmacy) is medication to be sent to? [place], [place]  3. Do they need a 30 day or 90 day supply? 90 day   Please call patient to confirm once prescription is sent to pharmacy

## 2019-06-23 LAB — CUP PACEART REMOTE DEVICE CHECK
Battery Remaining Longevity: 107 mo
Battery Remaining Percentage: 95.5 %
Battery Voltage: 2.99 V
Brady Statistic AP VP Percent: 29 %
Brady Statistic AP VS Percent: 1 %
Brady Statistic AS VP Percent: 71 %
Brady Statistic AS VS Percent: 1 %
Brady Statistic RA Percent Paced: 29 %
Brady Statistic RV Percent Paced: 99 %
Date Time Interrogation Session: 20210321021236
Implantable Lead Implant Date: 20090813
Implantable Lead Implant Date: 20090813
Implantable Lead Location: 753859
Implantable Lead Location: 753860
Implantable Pulse Generator Implant Date: 20180925
Lead Channel Impedance Value: 410 Ohm
Lead Channel Impedance Value: 560 Ohm
Lead Channel Pacing Threshold Amplitude: 0.5 V
Lead Channel Pacing Threshold Amplitude: 1 V
Lead Channel Pacing Threshold Pulse Width: 0.5 ms
Lead Channel Pacing Threshold Pulse Width: 0.5 ms
Lead Channel Sensing Intrinsic Amplitude: 11.3 mV
Lead Channel Sensing Intrinsic Amplitude: 3.1 mV
Lead Channel Setting Pacing Amplitude: 2 V
Lead Channel Setting Pacing Amplitude: 2.5 V
Lead Channel Setting Pacing Pulse Width: 0.5 ms
Lead Channel Setting Sensing Sensitivity: 4 mV
Pulse Gen Model: 2272
Pulse Gen Serial Number: 8943792

## 2019-06-24 ENCOUNTER — Other Ambulatory Visit: Payer: Self-pay | Admitting: Cardiology

## 2019-06-24 NOTE — Telephone Encounter (Signed)
*  STAT* If patient is at the pharmacy, call can be transferred to refill team.   1. Which medications need to be refilled? (please list name of each medication and dose if known)  atorvastatin (LIPITOR) 20 MG tablet  2. Which pharmacy/location (including street and city if local pharmacy) is medication to be sent to?  [place], [place]  3. Do they need a 30 day or 90 day supply? 90  Patient called [facility] and the pharmacy does not have any record of the rx being sent over. Please submit a new rx to the pharmacy

## 2019-06-25 MED ORDER — ATORVASTATIN CALCIUM 20 MG PO TABS: 20.0000 mg | ORAL_TABLET | Freq: Every day | ORAL | 3 refills | Status: AC

## 2019-07-01 DIAGNOSIS — Z79899 Other long term (current) drug therapy: Secondary | ICD-10-CM | POA: Diagnosis not present

## 2019-07-01 DIAGNOSIS — Z5181 Encounter for therapeutic drug level monitoring: Secondary | ICD-10-CM | POA: Diagnosis not present

## 2019-07-01 DIAGNOSIS — C9200 Acute myeloblastic leukemia, not having achieved remission: Secondary | ICD-10-CM | POA: Diagnosis not present

## 2019-07-02 DIAGNOSIS — C9200 Acute myeloblastic leukemia, not having achieved remission: Secondary | ICD-10-CM | POA: Diagnosis not present

## 2019-07-03 DIAGNOSIS — C9200 Acute myeloblastic leukemia, not having achieved remission: Secondary | ICD-10-CM | POA: Diagnosis not present

## 2019-07-04 DIAGNOSIS — M549 Dorsalgia, unspecified: Secondary | ICD-10-CM | POA: Diagnosis not present

## 2019-07-04 DIAGNOSIS — I951 Orthostatic hypotension: Secondary | ICD-10-CM | POA: Diagnosis not present

## 2019-07-04 DIAGNOSIS — Z888 Allergy status to other drugs, medicaments and biological substances status: Secondary | ICD-10-CM | POA: Diagnosis not present

## 2019-07-04 DIAGNOSIS — Z5181 Encounter for therapeutic drug level monitoring: Secondary | ICD-10-CM | POA: Diagnosis not present

## 2019-07-04 DIAGNOSIS — Z2821 Immunization not carried out because of patient refusal: Secondary | ICD-10-CM | POA: Diagnosis not present

## 2019-07-04 DIAGNOSIS — Z95828 Presence of other vascular implants and grafts: Secondary | ICD-10-CM | POA: Diagnosis not present

## 2019-07-04 DIAGNOSIS — R0600 Dyspnea, unspecified: Secondary | ICD-10-CM | POA: Diagnosis not present

## 2019-07-04 DIAGNOSIS — R42 Dizziness and giddiness: Secondary | ICD-10-CM | POA: Diagnosis not present

## 2019-07-04 DIAGNOSIS — I2510 Atherosclerotic heart disease of native coronary artery without angina pectoris: Secondary | ICD-10-CM | POA: Diagnosis not present

## 2019-07-04 DIAGNOSIS — R609 Edema, unspecified: Secondary | ICD-10-CM | POA: Diagnosis not present

## 2019-07-04 DIAGNOSIS — Z9889 Other specified postprocedural states: Secondary | ICD-10-CM | POA: Diagnosis not present

## 2019-07-04 DIAGNOSIS — I5189 Other ill-defined heart diseases: Secondary | ICD-10-CM | POA: Diagnosis not present

## 2019-07-04 DIAGNOSIS — C9200 Acute myeloblastic leukemia, not having achieved remission: Secondary | ICD-10-CM | POA: Diagnosis not present

## 2019-07-04 DIAGNOSIS — C9201 Acute myeloblastic leukemia, in remission: Secondary | ICD-10-CM | POA: Diagnosis not present

## 2019-07-04 DIAGNOSIS — Z955 Presence of coronary angioplasty implant and graft: Secondary | ICD-10-CM | POA: Diagnosis not present

## 2019-07-04 DIAGNOSIS — Z8679 Personal history of other diseases of the circulatory system: Secondary | ICD-10-CM | POA: Diagnosis not present

## 2019-07-04 DIAGNOSIS — K5900 Constipation, unspecified: Secondary | ICD-10-CM | POA: Diagnosis not present

## 2019-07-04 DIAGNOSIS — K449 Diaphragmatic hernia without obstruction or gangrene: Secondary | ICD-10-CM | POA: Diagnosis not present

## 2019-07-04 DIAGNOSIS — G9589 Other specified diseases of spinal cord: Secondary | ICD-10-CM | POA: Diagnosis not present

## 2019-07-04 DIAGNOSIS — Z79899 Other long term (current) drug therapy: Secondary | ICD-10-CM | POA: Diagnosis not present

## 2019-07-04 DIAGNOSIS — G8929 Other chronic pain: Secondary | ICD-10-CM | POA: Diagnosis not present

## 2019-07-04 DIAGNOSIS — K219 Gastro-esophageal reflux disease without esophagitis: Secondary | ICD-10-CM | POA: Diagnosis not present

## 2019-07-04 DIAGNOSIS — K589 Irritable bowel syndrome without diarrhea: Secondary | ICD-10-CM | POA: Diagnosis not present

## 2019-07-04 DIAGNOSIS — Z79891 Long term (current) use of opiate analgesic: Secondary | ICD-10-CM | POA: Diagnosis not present

## 2019-07-04 DIAGNOSIS — I10 Essential (primary) hypertension: Secondary | ICD-10-CM | POA: Diagnosis not present

## 2019-08-12 DIAGNOSIS — R519 Headache, unspecified: Secondary | ICD-10-CM | POA: Diagnosis not present

## 2019-08-12 DIAGNOSIS — R110 Nausea: Secondary | ICD-10-CM | POA: Diagnosis not present

## 2019-08-12 DIAGNOSIS — R61 Generalized hyperhidrosis: Secondary | ICD-10-CM | POA: Diagnosis not present

## 2019-08-12 DIAGNOSIS — C9200 Acute myeloblastic leukemia, not having achieved remission: Secondary | ICD-10-CM | POA: Diagnosis not present

## 2019-08-12 DIAGNOSIS — Z5111 Encounter for antineoplastic chemotherapy: Secondary | ICD-10-CM | POA: Diagnosis not present

## 2019-08-13 DIAGNOSIS — Z5111 Encounter for antineoplastic chemotherapy: Secondary | ICD-10-CM | POA: Diagnosis not present

## 2019-08-13 DIAGNOSIS — C9200 Acute myeloblastic leukemia, not having achieved remission: Secondary | ICD-10-CM | POA: Diagnosis not present

## 2019-08-14 DIAGNOSIS — C9200 Acute myeloblastic leukemia, not having achieved remission: Secondary | ICD-10-CM | POA: Diagnosis not present

## 2019-08-14 DIAGNOSIS — Z5111 Encounter for antineoplastic chemotherapy: Secondary | ICD-10-CM | POA: Diagnosis not present

## 2019-08-15 DIAGNOSIS — Z955 Presence of coronary angioplasty implant and graft: Secondary | ICD-10-CM | POA: Diagnosis not present

## 2019-08-15 DIAGNOSIS — Z79899 Other long term (current) drug therapy: Secondary | ICD-10-CM | POA: Diagnosis not present

## 2019-08-15 DIAGNOSIS — I509 Heart failure, unspecified: Secondary | ICD-10-CM | POA: Diagnosis not present

## 2019-08-15 DIAGNOSIS — I951 Orthostatic hypotension: Secondary | ICD-10-CM | POA: Diagnosis not present

## 2019-08-15 DIAGNOSIS — I2510 Atherosclerotic heart disease of native coronary artery without angina pectoris: Secondary | ICD-10-CM | POA: Diagnosis not present

## 2019-08-15 DIAGNOSIS — Z950 Presence of cardiac pacemaker: Secondary | ICD-10-CM | POA: Diagnosis not present

## 2019-08-15 DIAGNOSIS — G8929 Other chronic pain: Secondary | ICD-10-CM | POA: Diagnosis not present

## 2019-08-15 DIAGNOSIS — M549 Dorsalgia, unspecified: Secondary | ICD-10-CM | POA: Diagnosis not present

## 2019-08-15 DIAGNOSIS — C9200 Acute myeloblastic leukemia, not having achieved remission: Secondary | ICD-10-CM | POA: Diagnosis not present

## 2019-08-15 DIAGNOSIS — I442 Atrioventricular block, complete: Secondary | ICD-10-CM | POA: Diagnosis not present

## 2019-08-15 DIAGNOSIS — K589 Irritable bowel syndrome without diarrhea: Secondary | ICD-10-CM | POA: Diagnosis not present

## 2019-08-15 DIAGNOSIS — I110 Hypertensive heart disease with heart failure: Secondary | ICD-10-CM | POA: Diagnosis not present

## 2019-08-15 DIAGNOSIS — Z5111 Encounter for antineoplastic chemotherapy: Secondary | ICD-10-CM | POA: Diagnosis not present

## 2019-08-15 DIAGNOSIS — K219 Gastro-esophageal reflux disease without esophagitis: Secondary | ICD-10-CM | POA: Diagnosis not present

## 2019-08-15 DIAGNOSIS — Z8674 Personal history of sudden cardiac arrest: Secondary | ICD-10-CM | POA: Diagnosis not present

## 2019-09-18 ENCOUNTER — Ambulatory Visit (INDEPENDENT_AMBULATORY_CARE_PROVIDER_SITE_OTHER): Payer: Medicare Other | Admitting: *Deleted

## 2019-09-18 DIAGNOSIS — I442 Atrioventricular block, complete: Secondary | ICD-10-CM | POA: Diagnosis not present

## 2019-09-21 LAB — CUP PACEART REMOTE DEVICE CHECK
Battery Remaining Longevity: 107 mo
Battery Remaining Percentage: 95.5 %
Battery Voltage: 2.99 V
Brady Statistic AP VP Percent: 30 %
Brady Statistic AP VS Percent: 1 %
Brady Statistic AS VP Percent: 69 %
Brady Statistic AS VS Percent: 1 %
Brady Statistic RA Percent Paced: 30 %
Brady Statistic RV Percent Paced: 99 %
Date Time Interrogation Session: 20210619085648
Implantable Lead Implant Date: 20090813
Implantable Lead Implant Date: 20090813
Implantable Lead Location: 753859
Implantable Lead Location: 753860
Implantable Pulse Generator Implant Date: 20180925
Lead Channel Impedance Value: 430 Ohm
Lead Channel Impedance Value: 560 Ohm
Lead Channel Pacing Threshold Amplitude: 0.5 V
Lead Channel Pacing Threshold Amplitude: 1 V
Lead Channel Pacing Threshold Pulse Width: 0.5 ms
Lead Channel Pacing Threshold Pulse Width: 0.5 ms
Lead Channel Sensing Intrinsic Amplitude: 12 mV
Lead Channel Sensing Intrinsic Amplitude: 2.7 mV
Lead Channel Setting Pacing Amplitude: 2 V
Lead Channel Setting Pacing Amplitude: 2.5 V
Lead Channel Setting Pacing Pulse Width: 0.5 ms
Lead Channel Setting Sensing Sensitivity: 4 mV
Pulse Gen Model: 2272
Pulse Gen Serial Number: 8943792

## 2019-09-23 NOTE — Progress Notes (Signed)
Remote pacemaker transmission.   

## 2019-09-26 DIAGNOSIS — C9200 Acute myeloblastic leukemia, not having achieved remission: Secondary | ICD-10-CM | POA: Diagnosis not present

## 2019-09-26 DIAGNOSIS — M549 Dorsalgia, unspecified: Secondary | ICD-10-CM | POA: Diagnosis not present

## 2019-09-26 DIAGNOSIS — Z5111 Encounter for antineoplastic chemotherapy: Secondary | ICD-10-CM | POA: Diagnosis not present

## 2019-09-26 DIAGNOSIS — I10 Essential (primary) hypertension: Secondary | ICD-10-CM | POA: Diagnosis not present

## 2019-09-26 DIAGNOSIS — K5900 Constipation, unspecified: Secondary | ICD-10-CM | POA: Diagnosis not present

## 2019-09-26 DIAGNOSIS — G8929 Other chronic pain: Secondary | ICD-10-CM | POA: Diagnosis not present

## 2019-09-26 DIAGNOSIS — Z5189 Encounter for other specified aftercare: Secondary | ICD-10-CM | POA: Diagnosis not present

## 2019-09-26 DIAGNOSIS — Z79899 Other long term (current) drug therapy: Secondary | ICD-10-CM | POA: Diagnosis not present

## 2019-09-26 DIAGNOSIS — K449 Diaphragmatic hernia without obstruction or gangrene: Secondary | ICD-10-CM | POA: Diagnosis not present

## 2019-09-26 DIAGNOSIS — K219 Gastro-esophageal reflux disease without esophagitis: Secondary | ICD-10-CM | POA: Diagnosis not present

## 2019-10-23 DIAGNOSIS — R7302 Impaired glucose tolerance (oral): Secondary | ICD-10-CM | POA: Diagnosis not present

## 2019-10-23 DIAGNOSIS — M1990 Unspecified osteoarthritis, unspecified site: Secondary | ICD-10-CM | POA: Diagnosis not present

## 2019-10-23 DIAGNOSIS — C9200 Acute myeloblastic leukemia, not having achieved remission: Secondary | ICD-10-CM | POA: Diagnosis not present

## 2019-10-23 DIAGNOSIS — Z9861 Coronary angioplasty status: Secondary | ICD-10-CM | POA: Diagnosis not present

## 2019-10-23 DIAGNOSIS — M546 Pain in thoracic spine: Secondary | ICD-10-CM | POA: Diagnosis not present

## 2019-10-23 DIAGNOSIS — E785 Hyperlipidemia, unspecified: Secondary | ICD-10-CM | POA: Diagnosis not present

## 2019-11-04 DIAGNOSIS — Z5189 Encounter for other specified aftercare: Secondary | ICD-10-CM | POA: Diagnosis not present

## 2019-11-04 DIAGNOSIS — Z5111 Encounter for antineoplastic chemotherapy: Secondary | ICD-10-CM | POA: Diagnosis not present

## 2019-11-04 DIAGNOSIS — Z79899 Other long term (current) drug therapy: Secondary | ICD-10-CM | POA: Diagnosis not present

## 2019-11-04 DIAGNOSIS — Z5181 Encounter for therapeutic drug level monitoring: Secondary | ICD-10-CM | POA: Diagnosis not present

## 2019-11-04 DIAGNOSIS — C9200 Acute myeloblastic leukemia, not having achieved remission: Secondary | ICD-10-CM | POA: Diagnosis not present

## 2019-11-05 DIAGNOSIS — Z5111 Encounter for antineoplastic chemotherapy: Secondary | ICD-10-CM | POA: Diagnosis not present

## 2019-11-05 DIAGNOSIS — C9200 Acute myeloblastic leukemia, not having achieved remission: Secondary | ICD-10-CM | POA: Diagnosis not present

## 2019-11-06 DIAGNOSIS — Z5111 Encounter for antineoplastic chemotherapy: Secondary | ICD-10-CM | POA: Diagnosis not present

## 2019-11-06 DIAGNOSIS — C9200 Acute myeloblastic leukemia, not having achieved remission: Secondary | ICD-10-CM | POA: Diagnosis not present

## 2019-11-07 DIAGNOSIS — R600 Localized edema: Secondary | ICD-10-CM | POA: Diagnosis not present

## 2019-11-07 DIAGNOSIS — K5900 Constipation, unspecified: Secondary | ICD-10-CM | POA: Diagnosis not present

## 2019-11-07 DIAGNOSIS — Z955 Presence of coronary angioplasty implant and graft: Secondary | ICD-10-CM | POA: Diagnosis not present

## 2019-11-07 DIAGNOSIS — Z950 Presence of cardiac pacemaker: Secondary | ICD-10-CM | POA: Diagnosis not present

## 2019-11-07 DIAGNOSIS — C9200 Acute myeloblastic leukemia, not having achieved remission: Secondary | ICD-10-CM | POA: Diagnosis not present

## 2019-11-07 DIAGNOSIS — Z5111 Encounter for antineoplastic chemotherapy: Secondary | ICD-10-CM | POA: Diagnosis not present

## 2019-11-07 DIAGNOSIS — M545 Low back pain: Secondary | ICD-10-CM | POA: Diagnosis not present

## 2019-11-07 DIAGNOSIS — Z9049 Acquired absence of other specified parts of digestive tract: Secondary | ICD-10-CM | POA: Diagnosis not present

## 2019-11-07 DIAGNOSIS — R0609 Other forms of dyspnea: Secondary | ICD-10-CM | POA: Diagnosis not present

## 2019-11-07 DIAGNOSIS — I959 Hypotension, unspecified: Secondary | ICD-10-CM | POA: Diagnosis not present

## 2019-11-07 DIAGNOSIS — Z8674 Personal history of sudden cardiac arrest: Secondary | ICD-10-CM | POA: Diagnosis not present

## 2019-11-07 DIAGNOSIS — K219 Gastro-esophageal reflux disease without esophagitis: Secondary | ICD-10-CM | POA: Diagnosis not present

## 2019-11-07 DIAGNOSIS — G8929 Other chronic pain: Secondary | ICD-10-CM | POA: Diagnosis not present

## 2019-11-07 DIAGNOSIS — Z885 Allergy status to narcotic agent status: Secondary | ICD-10-CM | POA: Diagnosis not present

## 2019-11-07 DIAGNOSIS — Z888 Allergy status to other drugs, medicaments and biological substances status: Secondary | ICD-10-CM | POA: Diagnosis not present

## 2019-11-07 DIAGNOSIS — R42 Dizziness and giddiness: Secondary | ICD-10-CM | POA: Diagnosis not present

## 2019-11-07 DIAGNOSIS — Z79899 Other long term (current) drug therapy: Secondary | ICD-10-CM | POA: Diagnosis not present

## 2019-11-07 DIAGNOSIS — M549 Dorsalgia, unspecified: Secondary | ICD-10-CM | POA: Diagnosis not present

## 2019-11-19 DIAGNOSIS — C9200 Acute myeloblastic leukemia, not having achieved remission: Secondary | ICD-10-CM | POA: Diagnosis not present

## 2019-11-26 DIAGNOSIS — C9200 Acute myeloblastic leukemia, not having achieved remission: Secondary | ICD-10-CM | POA: Diagnosis not present

## 2019-12-03 DIAGNOSIS — C9200 Acute myeloblastic leukemia, not having achieved remission: Secondary | ICD-10-CM | POA: Diagnosis not present

## 2019-12-06 ENCOUNTER — Ambulatory Visit: Payer: Medicare Other | Attending: Internal Medicine

## 2019-12-06 DIAGNOSIS — Z23 Encounter for immunization: Secondary | ICD-10-CM

## 2019-12-06 NOTE — Progress Notes (Signed)
   Covid-19 Vaccination Clinic  Name:  Jamie Reese    MRN: [id number] DOB: [birth year]  12/06/2019  Jamie Reese was observed post Covid-19 immunization for 15 minutes without incident. She was provided with Vaccine Information Sheet and instruction to access the V-Safe system.   Jamie Reese was instructed to call 911 with any severe reactions post vaccine: Marland Kitchen Difficulty breathing  . Swelling of face and throat  . A fast heartbeat  . A bad rash all over body  . Dizziness and weakness

## 2019-12-16 DIAGNOSIS — C9200 Acute myeloblastic leukemia, not having achieved remission: Secondary | ICD-10-CM | POA: Diagnosis not present

## 2019-12-16 DIAGNOSIS — Z5111 Encounter for antineoplastic chemotherapy: Secondary | ICD-10-CM | POA: Diagnosis not present

## 2019-12-18 ENCOUNTER — Ambulatory Visit (INDEPENDENT_AMBULATORY_CARE_PROVIDER_SITE_OTHER): Payer: Medicare Other | Admitting: *Deleted

## 2019-12-18 DIAGNOSIS — I442 Atrioventricular block, complete: Secondary | ICD-10-CM

## 2019-12-19 DIAGNOSIS — G8929 Other chronic pain: Secondary | ICD-10-CM | POA: Diagnosis not present

## 2019-12-19 DIAGNOSIS — K5900 Constipation, unspecified: Secondary | ICD-10-CM | POA: Diagnosis not present

## 2019-12-19 DIAGNOSIS — Z79899 Other long term (current) drug therapy: Secondary | ICD-10-CM | POA: Diagnosis not present

## 2019-12-19 DIAGNOSIS — M549 Dorsalgia, unspecified: Secondary | ICD-10-CM | POA: Diagnosis not present

## 2019-12-19 DIAGNOSIS — K589 Irritable bowel syndrome without diarrhea: Secondary | ICD-10-CM | POA: Diagnosis not present

## 2019-12-19 DIAGNOSIS — I10 Essential (primary) hypertension: Secondary | ICD-10-CM | POA: Diagnosis not present

## 2019-12-19 DIAGNOSIS — I951 Orthostatic hypotension: Secondary | ICD-10-CM | POA: Diagnosis not present

## 2019-12-19 DIAGNOSIS — Z959 Presence of cardiac and vascular implant and graft, unspecified: Secondary | ICD-10-CM | POA: Diagnosis not present

## 2019-12-19 DIAGNOSIS — C9202 Acute myeloblastic leukemia, in relapse: Secondary | ICD-10-CM | POA: Diagnosis not present

## 2019-12-19 DIAGNOSIS — C9200 Acute myeloblastic leukemia, not having achieved remission: Secondary | ICD-10-CM | POA: Diagnosis not present

## 2019-12-19 DIAGNOSIS — I2510 Atherosclerotic heart disease of native coronary artery without angina pectoris: Secondary | ICD-10-CM | POA: Diagnosis not present

## 2019-12-19 DIAGNOSIS — K219 Gastro-esophageal reflux disease without esophagitis: Secondary | ICD-10-CM | POA: Diagnosis not present

## 2019-12-20 DIAGNOSIS — C9202 Acute myeloblastic leukemia, in relapse: Secondary | ICD-10-CM | POA: Diagnosis not present

## 2019-12-20 DIAGNOSIS — C9200 Acute myeloblastic leukemia, not having achieved remission: Secondary | ICD-10-CM | POA: Diagnosis not present

## 2019-12-20 LAB — CUP PACEART REMOTE DEVICE CHECK
Battery Remaining Longevity: 91 mo
Battery Remaining Percentage: 89 %
Battery Voltage: 2.95 V
Brady Statistic AP VP Percent: 29 %
Brady Statistic AP VS Percent: 1 %
Brady Statistic AS VP Percent: 71 %
Brady Statistic AS VS Percent: 1 %
Brady Statistic RA Percent Paced: 29 %
Brady Statistic RV Percent Paced: 99 %
Date Time Interrogation Session: 20210917013521
Implantable Lead Implant Date: 20090813
Implantable Lead Implant Date: 20090813
Implantable Lead Location: 753859
Implantable Lead Location: 753860
Implantable Pulse Generator Implant Date: 20180925
Lead Channel Impedance Value: 390 Ohm
Lead Channel Impedance Value: 530 Ohm
Lead Channel Pacing Threshold Amplitude: 0.5 V
Lead Channel Pacing Threshold Amplitude: 1 V
Lead Channel Pacing Threshold Pulse Width: 0.5 ms
Lead Channel Pacing Threshold Pulse Width: 0.5 ms
Lead Channel Sensing Intrinsic Amplitude: 12 mV
Lead Channel Sensing Intrinsic Amplitude: 2.7 mV
Lead Channel Setting Pacing Amplitude: 2 V
Lead Channel Setting Pacing Amplitude: 2.5 V
Lead Channel Setting Pacing Pulse Width: 0.5 ms
Lead Channel Setting Sensing Sensitivity: 4 mV
Pulse Gen Model: 2272
Pulse Gen Serial Number: 8943792

## 2019-12-23 NOTE — Progress Notes (Signed)
Remote pacemaker transmission.   

## 2019-12-26 DIAGNOSIS — I442 Atrioventricular block, complete: Secondary | ICD-10-CM | POA: Diagnosis not present

## 2019-12-26 DIAGNOSIS — K219 Gastro-esophageal reflux disease without esophagitis: Secondary | ICD-10-CM | POA: Diagnosis not present

## 2019-12-26 DIAGNOSIS — C9200 Acute myeloblastic leukemia, not having achieved remission: Secondary | ICD-10-CM | POA: Diagnosis not present

## 2019-12-26 DIAGNOSIS — I10 Essential (primary) hypertension: Secondary | ICD-10-CM | POA: Diagnosis not present

## 2019-12-26 DIAGNOSIS — M549 Dorsalgia, unspecified: Secondary | ICD-10-CM | POA: Diagnosis not present

## 2019-12-26 DIAGNOSIS — I2510 Atherosclerotic heart disease of native coronary artery without angina pectoris: Secondary | ICD-10-CM | POA: Diagnosis not present

## 2019-12-26 DIAGNOSIS — K5900 Constipation, unspecified: Secondary | ICD-10-CM | POA: Diagnosis not present

## 2019-12-26 DIAGNOSIS — K589 Irritable bowel syndrome without diarrhea: Secondary | ICD-10-CM | POA: Diagnosis not present

## 2019-12-26 DIAGNOSIS — G8929 Other chronic pain: Secondary | ICD-10-CM | POA: Diagnosis not present

## 2019-12-26 DIAGNOSIS — Z79899 Other long term (current) drug therapy: Secondary | ICD-10-CM | POA: Diagnosis not present

## 2019-12-26 DIAGNOSIS — Z950 Presence of cardiac pacemaker: Secondary | ICD-10-CM | POA: Diagnosis not present

## 2019-12-26 DIAGNOSIS — D61818 Other pancytopenia: Secondary | ICD-10-CM | POA: Diagnosis not present

## 2019-12-26 DIAGNOSIS — Z959 Presence of cardiac and vascular implant and graft, unspecified: Secondary | ICD-10-CM | POA: Diagnosis not present

## 2019-12-26 DIAGNOSIS — D709 Neutropenia, unspecified: Secondary | ICD-10-CM | POA: Diagnosis not present

## 2019-12-26 DIAGNOSIS — K449 Diaphragmatic hernia without obstruction or gangrene: Secondary | ICD-10-CM | POA: Diagnosis not present

## 2019-12-26 DIAGNOSIS — I951 Orthostatic hypotension: Secondary | ICD-10-CM | POA: Diagnosis not present

## 2019-12-26 DIAGNOSIS — C9202 Acute myeloblastic leukemia, in relapse: Secondary | ICD-10-CM | POA: Diagnosis not present

## 2019-12-30 DIAGNOSIS — C9202 Acute myeloblastic leukemia, in relapse: Secondary | ICD-10-CM | POA: Diagnosis not present

## 2019-12-30 DIAGNOSIS — D61818 Other pancytopenia: Secondary | ICD-10-CM | POA: Diagnosis not present

## 2019-12-30 DIAGNOSIS — Z5111 Encounter for antineoplastic chemotherapy: Secondary | ICD-10-CM | POA: Diagnosis not present

## 2019-12-31 DIAGNOSIS — Z5111 Encounter for antineoplastic chemotherapy: Secondary | ICD-10-CM | POA: Diagnosis not present

## 2019-12-31 DIAGNOSIS — C9200 Acute myeloblastic leukemia, not having achieved remission: Secondary | ICD-10-CM | POA: Diagnosis not present

## 2020-01-01 DIAGNOSIS — C9200 Acute myeloblastic leukemia, not having achieved remission: Secondary | ICD-10-CM | POA: Diagnosis not present

## 2020-01-01 DIAGNOSIS — Z5111 Encounter for antineoplastic chemotherapy: Secondary | ICD-10-CM | POA: Diagnosis not present

## 2020-01-02 DIAGNOSIS — Z5111 Encounter for antineoplastic chemotherapy: Secondary | ICD-10-CM | POA: Diagnosis not present

## 2020-01-02 DIAGNOSIS — C9200 Acute myeloblastic leukemia, not having achieved remission: Secondary | ICD-10-CM | POA: Diagnosis not present

## 2020-01-03 DIAGNOSIS — Z5111 Encounter for antineoplastic chemotherapy: Secondary | ICD-10-CM | POA: Diagnosis not present

## 2020-01-03 DIAGNOSIS — C9200 Acute myeloblastic leukemia, not having achieved remission: Secondary | ICD-10-CM | POA: Diagnosis not present

## 2020-01-04 DIAGNOSIS — C9200 Acute myeloblastic leukemia, not having achieved remission: Secondary | ICD-10-CM | POA: Diagnosis not present

## 2020-01-04 DIAGNOSIS — Z5111 Encounter for antineoplastic chemotherapy: Secondary | ICD-10-CM | POA: Diagnosis not present

## 2020-01-06 DIAGNOSIS — Z5111 Encounter for antineoplastic chemotherapy: Secondary | ICD-10-CM | POA: Diagnosis not present

## 2020-01-06 DIAGNOSIS — Z79899 Other long term (current) drug therapy: Secondary | ICD-10-CM | POA: Diagnosis not present

## 2020-01-06 DIAGNOSIS — Z5181 Encounter for therapeutic drug level monitoring: Secondary | ICD-10-CM | POA: Diagnosis not present

## 2020-01-06 DIAGNOSIS — I2510 Atherosclerotic heart disease of native coronary artery without angina pectoris: Secondary | ICD-10-CM | POA: Diagnosis not present

## 2020-01-06 DIAGNOSIS — I10 Essential (primary) hypertension: Secondary | ICD-10-CM | POA: Diagnosis not present

## 2020-01-06 DIAGNOSIS — C9200 Acute myeloblastic leukemia, not having achieved remission: Secondary | ICD-10-CM | POA: Diagnosis not present

## 2020-01-09 DIAGNOSIS — C9200 Acute myeloblastic leukemia, not having achieved remission: Secondary | ICD-10-CM | POA: Diagnosis not present

## 2020-01-13 DIAGNOSIS — R739 Hyperglycemia, unspecified: Secondary | ICD-10-CM | POA: Diagnosis not present

## 2020-01-13 DIAGNOSIS — E876 Hypokalemia: Secondary | ICD-10-CM | POA: Diagnosis not present

## 2020-01-13 DIAGNOSIS — C9200 Acute myeloblastic leukemia, not having achieved remission: Secondary | ICD-10-CM | POA: Diagnosis not present

## 2020-01-16 DIAGNOSIS — C9200 Acute myeloblastic leukemia, not having achieved remission: Secondary | ICD-10-CM | POA: Diagnosis not present

## 2020-01-20 DIAGNOSIS — C9200 Acute myeloblastic leukemia, not having achieved remission: Secondary | ICD-10-CM | POA: Diagnosis not present

## 2020-01-23 DIAGNOSIS — Z959 Presence of cardiac and vascular implant and graft, unspecified: Secondary | ICD-10-CM | POA: Diagnosis not present

## 2020-01-23 DIAGNOSIS — C9202 Acute myeloblastic leukemia, in relapse: Secondary | ICD-10-CM | POA: Diagnosis not present

## 2020-01-23 DIAGNOSIS — C9200 Acute myeloblastic leukemia, not having achieved remission: Secondary | ICD-10-CM | POA: Diagnosis not present

## 2020-01-27 DIAGNOSIS — I2510 Atherosclerotic heart disease of native coronary artery without angina pectoris: Secondary | ICD-10-CM | POA: Diagnosis not present

## 2020-01-27 DIAGNOSIS — C9200 Acute myeloblastic leukemia, not having achieved remission: Secondary | ICD-10-CM | POA: Diagnosis not present

## 2020-01-27 DIAGNOSIS — R791 Abnormal coagulation profile: Secondary | ICD-10-CM | POA: Diagnosis not present

## 2020-01-30 DIAGNOSIS — C9200 Acute myeloblastic leukemia, not having achieved remission: Secondary | ICD-10-CM | POA: Diagnosis not present

## 2020-01-31 ENCOUNTER — Telehealth: Payer: Self-pay | Admitting: Internal Medicine

## 2020-01-31 DIAGNOSIS — R9431 Abnormal electrocardiogram [ECG] [EKG]: Secondary | ICD-10-CM | POA: Diagnosis not present

## 2020-01-31 DIAGNOSIS — C9200 Acute myeloblastic leukemia, not having achieved remission: Secondary | ICD-10-CM | POA: Diagnosis not present

## 2020-01-31 DIAGNOSIS — D649 Anemia, unspecified: Secondary | ICD-10-CM | POA: Diagnosis not present

## 2020-01-31 DIAGNOSIS — I4460 Unspecified fascicular block: Secondary | ICD-10-CM | POA: Diagnosis not present

## 2020-01-31 NOTE — Telephone Encounter (Signed)
Attempted call patient. Patient's home # has voicemail for Jamie Reese who is not listed on DPR. Patient moving and questions concerning transferring care of device to another practice.

## 2020-01-31 NOTE — Telephone Encounter (Signed)
Patient gave permission to speak with her daughter. Informed that Merlin monitor should travel with her  Mom to where she will be living. Education done that when patient establishes care with another EP that care of device will be transferred to that provider.

## 2020-01-31 NOTE — Telephone Encounter (Signed)
     1. Has your device fired?   2. Is you device beeping?   3. Are you experiencing draining or swelling at device site?   4. Are you calling to see if we received your device transmission?   5. Have you passed out?   Jamie Reese is calling, she said pt is moving to [facility] and will try to get a cards there, she wanted to ask about pt's device, if they can bring it and transfer her device check there or pt have to return it    Please route to [place]

## 2020-02-03 DIAGNOSIS — C9200 Acute myeloblastic leukemia, not having achieved remission: Secondary | ICD-10-CM | POA: Diagnosis not present

## 2020-02-06 DIAGNOSIS — C9200 Acute myeloblastic leukemia, not having achieved remission: Secondary | ICD-10-CM | POA: Diagnosis not present

## 2020-02-10 DIAGNOSIS — Z5111 Encounter for antineoplastic chemotherapy: Secondary | ICD-10-CM | POA: Diagnosis not present

## 2020-02-10 DIAGNOSIS — C9200 Acute myeloblastic leukemia, not having achieved remission: Secondary | ICD-10-CM | POA: Diagnosis not present

## 2020-02-11 DIAGNOSIS — Z5111 Encounter for antineoplastic chemotherapy: Secondary | ICD-10-CM | POA: Diagnosis not present

## 2020-02-11 DIAGNOSIS — C9200 Acute myeloblastic leukemia, not having achieved remission: Secondary | ICD-10-CM | POA: Diagnosis not present

## 2020-02-12 DIAGNOSIS — Z5111 Encounter for antineoplastic chemotherapy: Secondary | ICD-10-CM | POA: Diagnosis not present

## 2020-02-12 DIAGNOSIS — C9200 Acute myeloblastic leukemia, not having achieved remission: Secondary | ICD-10-CM | POA: Diagnosis not present

## 2020-02-13 DIAGNOSIS — Z5111 Encounter for antineoplastic chemotherapy: Secondary | ICD-10-CM | POA: Diagnosis not present

## 2020-02-13 DIAGNOSIS — C9200 Acute myeloblastic leukemia, not having achieved remission: Secondary | ICD-10-CM | POA: Diagnosis not present

## 2020-02-14 DIAGNOSIS — C9200 Acute myeloblastic leukemia, not having achieved remission: Secondary | ICD-10-CM | POA: Diagnosis not present

## 2020-02-14 DIAGNOSIS — Z5111 Encounter for antineoplastic chemotherapy: Secondary | ICD-10-CM | POA: Diagnosis not present

## 2020-02-17 DIAGNOSIS — Z5111 Encounter for antineoplastic chemotherapy: Secondary | ICD-10-CM | POA: Diagnosis not present

## 2020-02-17 DIAGNOSIS — C9200 Acute myeloblastic leukemia, not having achieved remission: Secondary | ICD-10-CM | POA: Diagnosis not present

## 2020-02-18 DIAGNOSIS — Z5111 Encounter for antineoplastic chemotherapy: Secondary | ICD-10-CM | POA: Diagnosis not present

## 2020-02-18 DIAGNOSIS — C9200 Acute myeloblastic leukemia, not having achieved remission: Secondary | ICD-10-CM | POA: Diagnosis not present

## 2020-02-20 DIAGNOSIS — C9200 Acute myeloblastic leukemia, not having achieved remission: Secondary | ICD-10-CM | POA: Diagnosis not present

## 2020-02-21 DIAGNOSIS — K5900 Constipation, unspecified: Secondary | ICD-10-CM | POA: Diagnosis not present

## 2020-02-24 DIAGNOSIS — I110 Hypertensive heart disease with heart failure: Secondary | ICD-10-CM | POA: Diagnosis not present

## 2020-02-24 DIAGNOSIS — I2510 Atherosclerotic heart disease of native coronary artery without angina pectoris: Secondary | ICD-10-CM | POA: Diagnosis not present

## 2020-02-24 DIAGNOSIS — I509 Heart failure, unspecified: Secondary | ICD-10-CM | POA: Diagnosis not present

## 2020-02-24 DIAGNOSIS — D709 Neutropenia, unspecified: Secondary | ICD-10-CM | POA: Diagnosis not present

## 2020-02-24 DIAGNOSIS — I442 Atrioventricular block, complete: Secondary | ICD-10-CM | POA: Diagnosis not present

## 2020-02-24 DIAGNOSIS — D61818 Other pancytopenia: Secondary | ICD-10-CM | POA: Diagnosis not present

## 2020-02-24 DIAGNOSIS — C9200 Acute myeloblastic leukemia, not having achieved remission: Secondary | ICD-10-CM | POA: Diagnosis not present

## 2020-02-24 DIAGNOSIS — Z9689 Presence of other specified functional implants: Secondary | ICD-10-CM | POA: Diagnosis not present

## 2020-02-24 DIAGNOSIS — F419 Anxiety disorder, unspecified: Secondary | ICD-10-CM | POA: Diagnosis not present

## 2020-02-24 DIAGNOSIS — J45909 Unspecified asthma, uncomplicated: Secondary | ICD-10-CM | POA: Diagnosis not present

## 2020-02-25 DIAGNOSIS — D709 Neutropenia, unspecified: Secondary | ICD-10-CM | POA: Diagnosis not present

## 2020-02-25 DIAGNOSIS — D61818 Other pancytopenia: Secondary | ICD-10-CM | POA: Diagnosis not present

## 2020-02-25 DIAGNOSIS — C9200 Acute myeloblastic leukemia, not having achieved remission: Secondary | ICD-10-CM | POA: Diagnosis not present

## 2020-02-25 DIAGNOSIS — I110 Hypertensive heart disease with heart failure: Secondary | ICD-10-CM | POA: Diagnosis not present

## 2020-02-25 DIAGNOSIS — I509 Heart failure, unspecified: Secondary | ICD-10-CM | POA: Diagnosis not present

## 2020-02-25 DIAGNOSIS — J45909 Unspecified asthma, uncomplicated: Secondary | ICD-10-CM | POA: Diagnosis not present

## 2020-02-28 DIAGNOSIS — D61818 Other pancytopenia: Secondary | ICD-10-CM | POA: Diagnosis not present

## 2020-02-28 DIAGNOSIS — Z950 Presence of cardiac pacemaker: Secondary | ICD-10-CM | POA: Diagnosis not present

## 2020-02-28 DIAGNOSIS — I110 Hypertensive heart disease with heart failure: Secondary | ICD-10-CM | POA: Diagnosis not present

## 2020-02-28 DIAGNOSIS — I509 Heart failure, unspecified: Secondary | ICD-10-CM | POA: Diagnosis not present

## 2020-02-28 DIAGNOSIS — I2510 Atherosclerotic heart disease of native coronary artery without angina pectoris: Secondary | ICD-10-CM | POA: Diagnosis not present

## 2020-02-28 DIAGNOSIS — J45909 Unspecified asthma, uncomplicated: Secondary | ICD-10-CM | POA: Diagnosis not present

## 2020-02-28 DIAGNOSIS — C9200 Acute myeloblastic leukemia, not having achieved remission: Secondary | ICD-10-CM | POA: Diagnosis not present

## 2020-02-28 DIAGNOSIS — F419 Anxiety disorder, unspecified: Secondary | ICD-10-CM | POA: Diagnosis not present

## 2020-03-02 DIAGNOSIS — C9200 Acute myeloblastic leukemia, not having achieved remission: Secondary | ICD-10-CM | POA: Diagnosis not present

## 2020-03-05 DIAGNOSIS — C9200 Acute myeloblastic leukemia, not having achieved remission: Secondary | ICD-10-CM | POA: Diagnosis not present

## 2020-03-09 DIAGNOSIS — C9200 Acute myeloblastic leukemia, not having achieved remission: Secondary | ICD-10-CM | POA: Diagnosis not present

## 2020-03-11 DIAGNOSIS — C9200 Acute myeloblastic leukemia, not having achieved remission: Secondary | ICD-10-CM | POA: Diagnosis not present

## 2020-03-12 DIAGNOSIS — C9200 Acute myeloblastic leukemia, not having achieved remission: Secondary | ICD-10-CM | POA: Diagnosis not present

## 2020-03-16 DIAGNOSIS — Z5111 Encounter for antineoplastic chemotherapy: Secondary | ICD-10-CM | POA: Diagnosis not present

## 2020-03-16 DIAGNOSIS — C9200 Acute myeloblastic leukemia, not having achieved remission: Secondary | ICD-10-CM | POA: Diagnosis not present

## 2020-03-17 ENCOUNTER — Ambulatory Visit (INDEPENDENT_AMBULATORY_CARE_PROVIDER_SITE_OTHER): Payer: Medicare Other

## 2020-03-17 DIAGNOSIS — I442 Atrioventricular block, complete: Secondary | ICD-10-CM | POA: Diagnosis not present

## 2020-03-17 DIAGNOSIS — Z5111 Encounter for antineoplastic chemotherapy: Secondary | ICD-10-CM | POA: Diagnosis not present

## 2020-03-17 DIAGNOSIS — C9200 Acute myeloblastic leukemia, not having achieved remission: Secondary | ICD-10-CM | POA: Diagnosis not present

## 2020-03-18 DIAGNOSIS — Z5111 Encounter for antineoplastic chemotherapy: Secondary | ICD-10-CM | POA: Diagnosis not present

## 2020-03-18 DIAGNOSIS — C9200 Acute myeloblastic leukemia, not having achieved remission: Secondary | ICD-10-CM | POA: Diagnosis not present

## 2020-03-18 LAB — CUP PACEART REMOTE DEVICE CHECK
Battery Remaining Longevity: 107 mo
Battery Remaining Percentage: 95.5 %
Battery Voltage: 2.99 V
Brady Statistic AP VP Percent: 26 %
Brady Statistic AP VS Percent: 1 %
Brady Statistic AS VP Percent: 74 %
Brady Statistic AS VS Percent: 1 %
Brady Statistic RA Percent Paced: 26 %
Brady Statistic RV Percent Paced: 99 %
Date Time Interrogation Session: 20211215104531
Implantable Lead Implant Date: 20090813
Implantable Lead Implant Date: 20090813
Implantable Lead Location: 753859
Implantable Lead Location: 753860
Implantable Pulse Generator Implant Date: 20180925
Lead Channel Impedance Value: 400 Ohm
Lead Channel Impedance Value: 540 Ohm
Lead Channel Pacing Threshold Amplitude: 0.5 V
Lead Channel Pacing Threshold Amplitude: 1 V
Lead Channel Pacing Threshold Pulse Width: 0.5 ms
Lead Channel Pacing Threshold Pulse Width: 0.5 ms
Lead Channel Sensing Intrinsic Amplitude: 12 mV
Lead Channel Sensing Intrinsic Amplitude: 2.9 mV
Lead Channel Setting Pacing Amplitude: 2 V
Lead Channel Setting Pacing Amplitude: 2.5 V
Lead Channel Setting Pacing Pulse Width: 0.5 ms
Lead Channel Setting Sensing Sensitivity: 4 mV
Pulse Gen Model: 2272
Pulse Gen Serial Number: 8943792

## 2020-03-19 DIAGNOSIS — Z5111 Encounter for antineoplastic chemotherapy: Secondary | ICD-10-CM | POA: Diagnosis not present

## 2020-03-19 DIAGNOSIS — C9200 Acute myeloblastic leukemia, not having achieved remission: Secondary | ICD-10-CM | POA: Diagnosis not present

## 2020-03-20 DIAGNOSIS — Z5111 Encounter for antineoplastic chemotherapy: Secondary | ICD-10-CM | POA: Diagnosis not present

## 2020-03-20 DIAGNOSIS — C9200 Acute myeloblastic leukemia, not having achieved remission: Secondary | ICD-10-CM | POA: Diagnosis not present

## 2020-03-23 DIAGNOSIS — Z5111 Encounter for antineoplastic chemotherapy: Secondary | ICD-10-CM | POA: Diagnosis not present

## 2020-03-23 DIAGNOSIS — C9200 Acute myeloblastic leukemia, not having achieved remission: Secondary | ICD-10-CM | POA: Diagnosis not present

## 2020-03-24 DIAGNOSIS — C9200 Acute myeloblastic leukemia, not having achieved remission: Secondary | ICD-10-CM | POA: Diagnosis not present

## 2020-03-24 DIAGNOSIS — Z5111 Encounter for antineoplastic chemotherapy: Secondary | ICD-10-CM | POA: Diagnosis not present

## 2020-03-24 DIAGNOSIS — D802 Selective deficiency of immunoglobulin A [IgA]: Secondary | ICD-10-CM | POA: Diagnosis not present

## 2020-03-25 DIAGNOSIS — C9200 Acute myeloblastic leukemia, not having achieved remission: Secondary | ICD-10-CM | POA: Diagnosis not present

## 2020-03-25 DIAGNOSIS — D802 Selective deficiency of immunoglobulin A [IgA]: Secondary | ICD-10-CM | POA: Diagnosis not present

## 2020-03-26 DIAGNOSIS — C9200 Acute myeloblastic leukemia, not having achieved remission: Secondary | ICD-10-CM | POA: Diagnosis not present

## 2020-03-30 DIAGNOSIS — C9200 Acute myeloblastic leukemia, not having achieved remission: Secondary | ICD-10-CM | POA: Diagnosis not present

## 2020-04-01 NOTE — Progress Notes (Signed)
Remote pacemaker transmission.   

## 2020-04-02 ENCOUNTER — Telehealth: Payer: Self-pay

## 2020-04-02 DIAGNOSIS — D802 Selective deficiency of immunoglobulin A [IgA]: Secondary | ICD-10-CM | POA: Diagnosis not present

## 2020-04-02 DIAGNOSIS — C9200 Acute myeloblastic leukemia, not having achieved remission: Secondary | ICD-10-CM | POA: Diagnosis not present

## 2020-07-03 DEATH — deceased

## 2020-07-16 NOTE — Telephone Encounter (Signed)
Open in error
